# Patient Record
Sex: Female | Born: 1955 | Race: White | Hispanic: No | Marital: Married | Smoking: Never smoker
Health system: Southern US, Community
[De-identification: ages and names within clinical notes are randomized; demographics above are authoritative.]

## PROBLEM LIST (undated history)

## (undated) DIAGNOSIS — E785 Hyperlipidemia, unspecified: Secondary | ICD-10-CM

## (undated) DIAGNOSIS — T7840XA Allergy, unspecified, initial encounter: Secondary | ICD-10-CM

## (undated) DIAGNOSIS — G43909 Migraine, unspecified, not intractable, without status migrainosus: Secondary | ICD-10-CM

## (undated) DIAGNOSIS — R011 Cardiac murmur, unspecified: Secondary | ICD-10-CM

## (undated) DIAGNOSIS — I10 Essential (primary) hypertension: Secondary | ICD-10-CM

## (undated) HISTORY — PX: OVARIAN CYST REMOVAL: SHX89

## (undated) HISTORY — DX: Allergy, unspecified, initial encounter: T78.40XA

## (undated) HISTORY — DX: Essential (primary) hypertension: I10

## (undated) HISTORY — DX: Cardiac murmur, unspecified: R01.1

## (undated) HISTORY — DX: Hyperlipidemia, unspecified: E78.5

## (undated) HISTORY — DX: Migraine, unspecified, not intractable, without status migrainosus: G43.909

## (undated) HISTORY — PX: APPENDECTOMY: SHX54

---

## 2015-09-09 LAB — HM COLONOSCOPY

## 2020-06-21 ENCOUNTER — Ambulatory Visit: Payer: Self-pay | Admitting: Nurse Practitioner

## 2020-06-28 ENCOUNTER — Encounter: Payer: Self-pay | Admitting: Nurse Practitioner

## 2020-06-28 ENCOUNTER — Ambulatory Visit (INDEPENDENT_AMBULATORY_CARE_PROVIDER_SITE_OTHER): Payer: BC Managed Care – PPO | Admitting: Nurse Practitioner

## 2020-06-28 ENCOUNTER — Other Ambulatory Visit: Payer: Self-pay

## 2020-06-28 VITALS — BP 150/69 | HR 86 | Temp 98.8°F | Resp 20 | Ht 62.0 in | Wt 176.0 lb

## 2020-06-28 DIAGNOSIS — Z139 Encounter for screening, unspecified: Secondary | ICD-10-CM | POA: Insufficient documentation

## 2020-06-28 DIAGNOSIS — E785 Hyperlipidemia, unspecified: Secondary | ICD-10-CM | POA: Diagnosis not present

## 2020-06-28 DIAGNOSIS — I10 Essential (primary) hypertension: Secondary | ICD-10-CM | POA: Insufficient documentation

## 2020-06-28 DIAGNOSIS — Z7689 Persons encountering health services in other specified circumstances: Secondary | ICD-10-CM | POA: Diagnosis not present

## 2020-06-28 DIAGNOSIS — G43909 Migraine, unspecified, not intractable, without status migrainosus: Secondary | ICD-10-CM | POA: Diagnosis not present

## 2020-06-28 DIAGNOSIS — Z0001 Encounter for general adult medical examination with abnormal findings: Secondary | ICD-10-CM | POA: Insufficient documentation

## 2020-06-28 DIAGNOSIS — Z1231 Encounter for screening mammogram for malignant neoplasm of breast: Secondary | ICD-10-CM

## 2020-06-28 NOTE — Assessment & Plan Note (Addendum)
-  requested medical records -[facility], includes her migraine medication as well

## 2020-06-28 NOTE — Assessment & Plan Note (Signed)
-  BP elevated today at 150/69 -taking enalapril 10 mg daily

## 2020-06-28 NOTE — Progress Notes (Signed)
New Patient Office Visit  Subjective:  Patient ID: Erin Griffin, female    DOB: 1955/11/15  Age: 65 y.o. MRN: [id number]  CC:  Chief Complaint  Patient presents with  . New Patient (Initial Visit)    HPI Erin Griffin presents for new patient visit. Transferring care from [facility] in [place] Last physical was in June 2021. Last labs were performed at that time.  Past Medical History:  Diagnosis Date  . Heart murmur    Phreesia 06/19/2020  . Hyperlipidemia    Phreesia 06/19/2020  . Hypertension    Phreesia 06/19/2020  . Migraine    no aura    Past Surgical History:  Procedure Laterality Date  . APPENDECTOMY N/A    Phreesia 06/19/2020  . OVARIAN CYST REMOVAL Right     History reviewed. No pertinent family history.  Social History   Socioeconomic History  . Marital status: Married    Spouse name: Not on file  . Number of children: Not on file  . Years of education: Not on file  . Highest education level: Not on file  Occupational History  . Occupation: Retired  Tobacco Use  . Smoking status: Never Smoker  . Smokeless tobacco: Never Used  Vaping Use  . Vaping Use: Never used  Substance and Sexual Activity  . Alcohol use: Yes    Alcohol/week: 2.0 standard drinks    Types: 2 Glasses of wine per week    Comment: per week   . Drug use: Never  . Sexual activity: Yes  Other Topics Concern  . Not on file  Social History Narrative  . Not on file   Social Determinants of Health   Financial Resource Strain: Not on file  Food Insecurity: Not on file  Transportation Needs: Not on file  Physical Activity: Not on file  Stress: Not on file  Social Connections: Not on file  Intimate Partner Violence: Not on file    ROS Review of Systems  Constitutional: Negative.   Respiratory: Negative.   Cardiovascular: Negative.   Neurological:       Cervical radiculopathy- did PT but still has some issue with neck range of motion; no acute concerns  today    Objective:   Today's Vitals: BP (!) 150/69   Pulse 86   Temp 98.8 F (37.1 C)   Resp 20   Ht 5' 2"  (1.575 m)   Wt 176 lb (79.8 kg)   SpO2 97%   BMI 32.19 kg/m   Physical Exam  Assessment & Plan:   Problem List Items Addressed This Visit      Cardiovascular and Mediastinum   Hypertension, essential    -BP elevated today at 150/69 -taking enalapril 10 mg daily      Relevant Medications   simvastatin (ZOCOR) 20 MG tablet   enalapril (VASOTEC) 10 MG tablet   Other Relevant Orders   CBC with Differential/Platelet   CMP14+EGFR   Migraine    -no issues today -takes eletriptan 20 mg PRN      Relevant Medications   simvastatin (ZOCOR) 20 MG tablet   enalapril (VASOTEC) 10 MG tablet   eletriptan (RELPAX) 20 MG tablet     Other   Hyperlipidemia    -no labs to review today -takes simvastatin 20 mg daily      Relevant Medications   simvastatin (ZOCOR) 20 MG tablet   enalapril (VASOTEC) 10 MG tablet   Other Relevant Orders   Lipid Panel With LDL/HDL Ratio  Encounter to establish care - Primary    -requested medical records [facility], includes her migraine medication as well      Relevant Orders   CBC with Differential/Platelet   CMP14+EGFR   Lipid Panel With LDL/HDL Ratio   Screening due    -will screen for HCV and HIV with next set of labs      Relevant Orders   MM Digital Screening   HCV Ab w/Rflx to Verification   HIV Antibody (routine testing w rflx)    Other Visit Diagnoses    Breast cancer screening by mammogram       Relevant Orders   MM Digital Screening      Outpatient Encounter Medications as of 06/28/2020  Medication Sig  . eletriptan (RELPAX) 20 MG tablet Take 20 mg by mouth as needed for migraine or headache. May repeat in 2 hours if headache persists or recurs.  . enalapril (VASOTEC) 10 MG tablet Take 10 mg by mouth daily.  . simvastatin (ZOCOR) 20 MG tablet Take 20 mg by mouth daily.   No  facility-administered encounter medications on file as of 06/28/2020.    Follow-up: Return in about 1 month (around 07/29/2020) for Lab follow-up.   Noreene Larsson, NP

## 2020-06-28 NOTE — Assessment & Plan Note (Signed)
-  will screen for HCV and HIV with next set of labs 

## 2020-06-28 NOTE — Assessment & Plan Note (Signed)
-  no issues today -takes eletriptan 20 mg PRN

## 2020-06-28 NOTE — Assessment & Plan Note (Signed)
-  no labs to review today -takes simvastatin 20 mg daily

## 2020-06-28 NOTE — Patient Instructions (Addendum)
It was great meeting you today.  We will meet up again in 1 month for a lab follow-up.  If you need any refills, please call our office and let us know.  Please have fasting labs drawn 2-3 days prior to your next visit.

## 2020-07-14 ENCOUNTER — Ambulatory Visit (HOSPITAL_COMMUNITY)
Admission: RE | Admit: 2020-07-14 | Discharge: 2020-07-14 | Disposition: A | Discharge status: Home / Self Care | Payer: BC Managed Care – PPO | Source: Ambulatory Visit | Attending: Nurse Practitioner | Admitting: Nurse Practitioner

## 2020-07-14 ENCOUNTER — Other Ambulatory Visit: Payer: Self-pay

## 2020-07-14 ENCOUNTER — Encounter (HOSPITAL_COMMUNITY): Payer: Self-pay

## 2020-07-14 DIAGNOSIS — Z1231 Encounter for screening mammogram for malignant neoplasm of breast: Secondary | ICD-10-CM | POA: Insufficient documentation

## 2020-07-14 DIAGNOSIS — Z139 Encounter for screening, unspecified: Secondary | ICD-10-CM

## 2020-07-25 DIAGNOSIS — Z139 Encounter for screening, unspecified: Secondary | ICD-10-CM | POA: Diagnosis not present

## 2020-07-25 DIAGNOSIS — R7301 Impaired fasting glucose: Secondary | ICD-10-CM | POA: Diagnosis not present

## 2020-07-25 DIAGNOSIS — E785 Hyperlipidemia, unspecified: Secondary | ICD-10-CM | POA: Diagnosis not present

## 2020-07-25 DIAGNOSIS — Z7689 Persons encountering health services in other specified circumstances: Secondary | ICD-10-CM | POA: Diagnosis not present

## 2020-07-25 DIAGNOSIS — I10 Essential (primary) hypertension: Secondary | ICD-10-CM | POA: Diagnosis not present

## 2020-07-26 ENCOUNTER — Other Ambulatory Visit: Payer: Self-pay | Admitting: Nurse Practitioner

## 2020-07-26 DIAGNOSIS — R7301 Impaired fasting glucose: Secondary | ICD-10-CM

## 2020-07-26 LAB — LIPID PANEL WITH LDL/HDL RATIO
Cholesterol, Total: 200 mg/dL — ABNORMAL HIGH (ref 100–199)
HDL: 60 mg/dL (ref >39)
LDL Chol Calc (NIH): 114 mg/dL — ABNORMAL HIGH (ref 0–99)
LDL/HDL Ratio: 1.9 ratio (ref 0.0–3.2)
Triglycerides: 151 mg/dL — ABNORMAL HIGH (ref 0–149)
VLDL Cholesterol Cal: 26 mg/dL (ref 5–40)

## 2020-07-26 LAB — CMP14+EGFR
ALT: 22 IU/L (ref 0–32)
AST: 22 IU/L (ref 0–40)
Albumin/Globulin Ratio: 2 (ref 1.2–2.2)
Albumin: 4.5 g/dL (ref 3.8–4.8)
Alkaline Phosphatase: 109 IU/L (ref 44–121)
BUN/Creatinine Ratio: 25 (ref 12–28)
BUN: 19 mg/dL (ref 8–27)
Bilirubin Total: 0.5 mg/dL (ref 0.0–1.2)
CO2: 23 mmol/L (ref 20–29)
Calcium: 9.8 mg/dL (ref 8.7–10.3)
Chloride: 102 mmol/L (ref 96–106)
Creatinine, Ser: 0.77 mg/dL (ref 0.57–1.00)
GFR calc Af Amer: 95 mL/min/{1.73_m2} (ref >59)
GFR calc non Af Amer: 82 mL/min/{1.73_m2} (ref >59)
Globulin, Total: 2.2 g/dL (ref 1.5–4.5)
Glucose: 104 mg/dL — ABNORMAL HIGH (ref 65–99)
Potassium: 4.5 mmol/L (ref 3.5–5.2)
Sodium: 143 mmol/L (ref 134–144)
Total Protein: 6.7 g/dL (ref 6.0–8.5)

## 2020-07-26 LAB — CBC WITH DIFFERENTIAL/PLATELET
Basophils Absolute: 0.1 10*3/uL (ref 0.0–0.2)
Basos: 1 %
EOS (ABSOLUTE): 0.1 10*3/uL (ref 0.0–0.4)
Eos: 3 %
Hematocrit: 43.1 % (ref 34.0–46.6)
Hemoglobin: 14.1 g/dL (ref 11.1–15.9)
Immature Grans (Abs): 0 10*3/uL (ref 0.0–0.1)
Immature Granulocytes: 0 %
Lymphocytes Absolute: 1.8 10*3/uL (ref 0.7–3.1)
Lymphs: 32 %
MCH: 30.7 pg (ref 26.6–33.0)
MCHC: 32.7 g/dL (ref 31.5–35.7)
MCV: 94 fL (ref 79–97)
Monocytes Absolute: 0.4 10*3/uL (ref 0.1–0.9)
Monocytes: 7 %
Neutrophils Absolute: 3.3 10*3/uL (ref 1.4–7.0)
Neutrophils: 57 %
Platelets: 235 10*3/uL (ref 150–450)
RBC: 4.59 x10E6/uL (ref 3.77–5.28)
RDW: 12.4 % (ref 11.7–15.4)
WBC: 5.7 10*3/uL (ref 3.4–10.8)

## 2020-07-26 LAB — HIV ANTIBODY (ROUTINE TESTING W REFLEX): HIV Screen 4th Generation wRfx: NONREACTIVE

## 2020-07-26 LAB — HCV AB W/RFLX TO VERIFICATION: HCV Ab: <0.1 s/co ratio (ref 0.0–0.9)

## 2020-07-26 LAB — HCV INTERPRETATION

## 2020-07-27 LAB — SPECIMEN STATUS REPORT

## 2020-07-27 LAB — HEMOGLOBIN A1C
Est. average glucose Bld gHb Est-mCnc: 123 mg/dL
Hgb A1c MFr Bld: 5.9 % — ABNORMAL HIGH (ref 4.8–5.6)

## 2020-07-27 NOTE — Progress Notes (Signed)
A1c is 5.9 which is in the prediabetic range; no need to add or change medications. Her cholesterol is a little elevated, but we will discuss that at her visit on 2/25.

## 2020-07-29 ENCOUNTER — Other Ambulatory Visit: Payer: Self-pay

## 2020-07-29 ENCOUNTER — Encounter: Payer: Self-pay | Admitting: Nurse Practitioner

## 2020-07-29 ENCOUNTER — Ambulatory Visit: Payer: BC Managed Care – PPO | Admitting: Nurse Practitioner

## 2020-07-29 DIAGNOSIS — E785 Hyperlipidemia, unspecified: Secondary | ICD-10-CM

## 2020-07-29 DIAGNOSIS — G43909 Migraine, unspecified, not intractable, without status migrainosus: Secondary | ICD-10-CM

## 2020-07-29 DIAGNOSIS — I10 Essential (primary) hypertension: Secondary | ICD-10-CM | POA: Diagnosis not present

## 2020-07-29 MED ORDER — PROPRANOLOL HCL 40 MG PO TABS: 40.0000 mg | ORAL_TABLET | Freq: Every day | ORAL | 1 refills | Status: DC

## 2020-07-29 NOTE — Progress Notes (Signed)
Established Patient Office Visit  Subjective:  Patient ID: Erin Griffin, female    DOB: Dec 11, 1955  Age: 65 y.o. MRN: [id number]  CC:  Chief Complaint  Patient presents with  . Follow-up    Go over labs.     HPI Erin Griffin presents for lab follow-up. She has HTN, and her BP has been running high.  She states that she been having migraines several times per week. She has been taking eletriptan, and that sometimes works. She is having significantly > 4 migraine days per month. Her pain starts on the right side of her head near her ear and is worse in the AM.  Past Medical History:  Diagnosis Date  . Heart murmur    Phreesia 06/19/2020  . Hyperlipidemia    Phreesia 06/19/2020  . Hypertension    Phreesia 06/19/2020  . Migraine    no aura    Past Surgical History:  Procedure Laterality Date  . APPENDECTOMY N/A    Phreesia 06/19/2020  . OVARIAN CYST REMOVAL Right     Family History  Problem Relation Age of Onset  . Breast cancer Mother   . Breast cancer Sister     Social History   Socioeconomic History  . Marital status: Married    Spouse name: Not on file  . Number of children: Not on file  . Years of education: Not on file  . Highest education level: Not on file  Occupational History  . Occupation: Retired  Tobacco Use  . Smoking status: Never Smoker  . Smokeless tobacco: Never Used  Vaping Use  . Vaping Use: Never used  Substance and Sexual Activity  . Alcohol use: Yes    Alcohol/week: 2.0 standard drinks    Types: 2 Glasses of wine per week    Comment: per week   . Drug use: Never  . Sexual activity: Yes  Other Topics Concern  . Not on file  Social History Narrative  . Not on file   Social Determinants of Health   Financial Resource Strain: Not on file  Food Insecurity: Not on file  Transportation Needs: Not on file  Physical Activity: Not on file  Stress: Not on file  Social Connections: Not on file  Intimate Partner Violence: Not on  file    Outpatient Medications Prior to Visit  Medication Sig Dispense Refill  . eletriptan (RELPAX) 20 MG tablet Take 20 mg by mouth as needed for migraine or headache. May repeat in 2 hours if headache persists or recurs.    . enalapril (VASOTEC) 10 MG tablet Take 10 mg by mouth daily.    . simvastatin (ZOCOR) 20 MG tablet Take 20 mg by mouth daily.     No facility-administered medications prior to visit.    Allergies  Allergen Reactions  . Shellfish Allergy Hives, Itching and Swelling    ROS Review of Systems  Constitutional: Negative.   Respiratory: Negative.   Cardiovascular: Negative.   Neurological: Positive for headaches.  Psychiatric/Behavioral: Negative.       Objective:    Physical Exam Constitutional:      Appearance: Normal appearance.  Cardiovascular:     Rate and Rhythm: Normal rate and regular rhythm.     Pulses: Normal pulses.     Heart sounds: Normal heart sounds.  Pulmonary:     Effort: Pulmonary effort is normal.     Breath sounds: Normal breath sounds.  Neurological:     General: No focal deficit present.  Mental Status: She is alert and oriented to person, place, and time.     Cranial Nerves: No cranial nerve deficit.     Comments: Unequal pupils with R>L (she states this is her normal x 40 years)  Psychiatric:        Mood and Affect: Mood normal.        Behavior: Behavior normal.        Thought Content: Thought content normal.        Judgment: Judgment normal.     BP (!) 150/83   Pulse 78   Temp 97.8 F (36.6 C)   Resp 18   Ht 5\' 2"  (1.575 m)   Wt 177 lb (80.3 kg)   SpO2 98%   BMI 32.37 kg/m  Wt Readings from Last 3 Encounters:  07/29/20 177 lb (80.3 kg)  06/28/20 176 lb (79.8 kg)     Health Maintenance Due  Topic Date Due  . COVID-19 Vaccine (1) Never done  . COLONOSCOPY (Pts 45-68yrs Insurance coverage will need to be confirmed)  Never done    There are no preventive care reminders to display for this patient.  No  results found for: TSH Lab Results  Component Value Date   WBC 5.7 07/25/2020   HGB 14.1 07/25/2020   HCT 43.1 07/25/2020   MCV 94 07/25/2020   PLT 235 07/25/2020   Lab Results  Component Value Date   NA 143 07/25/2020   K 4.5 07/25/2020   CO2 23 07/25/2020   GLUCOSE 104 (H) 07/25/2020   BUN 19 07/25/2020   CREATININE 0.77 07/25/2020   BILITOT 0.5 07/25/2020   ALKPHOS 109 07/25/2020   AST 22 07/25/2020   ALT 22 07/25/2020   PROT 6.7 07/25/2020   ALBUMIN 4.5 07/25/2020   CALCIUM 9.8 07/25/2020   Lab Results  Component Value Date   CHOL 200 (H) 07/25/2020   Lab Results  Component Value Date   HDL 60 07/25/2020   Lab Results  Component Value Date   LDLCALC 114 (H) 07/25/2020   Lab Results  Component Value Date   TRIG 151 (H) 07/25/2020   No results found for: CHOLHDL Lab Results  Component Value Date   HGBA1C 5.9 (H) 07/25/2020      Assessment & Plan:   Problem List Items Addressed This Visit      Cardiovascular and Mediastinum   Hypertension, essential    -BP still elevated today -Rx. Propranolol (may help with migraines as well)      Relevant Medications   propranolol (INDERAL) 40 MG tablet   Migraine    -takes eletriptan PRN, but she is still having a lot of migraines; several per week -Rx. Propranolol -if no improvement in 4 weeks, return to clinic (sooner if headaches get worse) -may consider nurtec, ajovy, or emgality if symptoms don't resolve, or may consider neuro consult      Relevant Medications   propranolol (INDERAL) 40 MG tablet     Other   Hyperlipidemia    Lab Results  Component Value Date   CHOL 200 (H) 07/25/2020   HDL 60 07/25/2020   LDLCALC 114 (H) 07/25/2020   TRIG 151 (H) 07/25/2020  -will use diet and exercise today to reduce LDL -if no improvement at time of physical, may increase simvastatin       Relevant Medications   propranolol (INDERAL) 40 MG tablet      Meds ordered this encounter  Medications  .  propranolol (INDERAL) 40 MG tablet  Sig: Take 1 tablet (40 mg total) by mouth at bedtime.    Dispense:  90 tablet    Refill:  1    Follow-up: Return in about 6 months (around 01/26/2021) for physical exam (same day fasting labs).    Erin Roberts, NP

## 2020-07-29 NOTE — Assessment & Plan Note (Signed)
Lab Results  Component Value Date   CHOL 200 (H) 07/25/2020   HDL 60 07/25/2020   LDLCALC 114 (H) 07/25/2020   TRIG 151 (H) 07/25/2020  -will use diet and exercise today to reduce LDL -if no improvement at time of physical, may increase simvastatin

## 2020-07-29 NOTE — Assessment & Plan Note (Signed)
-  BP still elevated today -Rx. Propranolol (may help with migraines as well)

## 2020-07-29 NOTE — Patient Instructions (Addendum)
We will meet up in 6 months for a physical. If migraines do not improve in 4 weeks, please return to the clinic. Come in sooner if they get worse. Preventing High Cholesterol Cholesterol is a white, waxy substance similar to fat that the human body needs to help build cells. The liver makes all the cholesterol that a person's body needs. Having high cholesterol (hypercholesterolemia) increases your risk for heart disease and stroke. Extra or excess cholesterol comes from the food that you eat. High cholesterol can often be prevented with diet and lifestyle changes. If you already have high cholesterol, you can control it with diet, lifestyle changes, and medicines. How can high cholesterol affect me? If you have high cholesterol, fatty deposits (plaques) may build up on the walls of your blood vessels. The blood vessels that carry blood away from your heart are called arteries. Plaques make the arteries narrower and stiffer. This in turn can:  Restrict or block blood flow and cause blood clots to form.  Increase your risk for heart attack and stroke. What can increase my risk for high cholesterol? This condition is more likely to develop in people who:  Eat foods that are high in saturated fat or cholesterol. Saturated fat is mostly found in foods that come from animal sources.  Are overweight.  Are not getting enough exercise.  Have a family history of high cholesterol (familial hypercholesterolemia). What actions can I take to prevent this? Nutrition  Eat less saturated fat.  Avoid trans fats (partially hydrogenated oils). These are often found in margarine and in some baked goods, fried foods, and snacks bought in packages.  Avoid precooked or cured meat, such as bacon, sausages, or meat loaves.  Avoid foods and drinks that have added sugars.  Eat more fruits, vegetables, and whole grains.  Choose healthy sources of protein, such as fish, poultry, lean cuts of red meat, beans,  peas, lentils, and nuts.  Choose healthy sources of fat, such as: ? Nuts. ? Vegetable oils, especially olive oil. ? Fish that have healthy fats, such as omega-3 fatty acids. These fish include mackerel or salmon.   Lifestyle  Lose weight if you are overweight. Maintaining a healthy body mass index (BMI) can help prevent or control high cholesterol. It can also lower your risk for diabetes and high blood pressure. Ask your health care provider to help you with a diet and exercise plan to lose weight safely.  Do not use any products that contain nicotine or tobacco, such as cigarettes, e-cigarettes, and chewing tobacco. If you need help quitting, ask your health care provider. Alcohol use  Do not drink alcohol if: ? Your health care provider tells you not to drink. ? You are pregnant, may be pregnant, or are planning to become pregnant.  If you drink alcohol: ? Limit how much you use to:  0-1 drink a day for women.  0-2 drinks a day for men. ? Be aware of how much alcohol is in your drink. In the [place] one drink equals one 12 oz bottle of beer (355 mL), one 5 oz glass of wine (148 mL), or one 1 oz glass of hard liquor (44 mL). Activity  Get enough exercise. Do exercises as told by your health care provider.  Each week, do at least 150 minutes of exercise that takes a medium level of effort (moderate-intensity exercise). This kind of exercise: ? Makes your heart beat faster while allowing you to still be able to talk. ? Can  be done in short sessions several times a day or longer sessions a few times a week. For example, on 5 days each week, you could walk fast or ride your bike 3 times a day for 10 minutes each time.   Medicines  Your health care provider may recommend medicines to help lower cholesterol. This may be a medicine to lower the amount of cholesterol that your liver makes. You may need medicine if: ? Diet and lifestyle changes have not lowered your cholesterol  enough. ? You have high cholesterol and other risk factors for heart disease or stroke.  Take over-the-counter and prescription medicines only as told by your health care provider. General information  Manage your risk factors for high cholesterol. Talk with your health care provider about all your risk factors and how to lower your risk.  Manage other conditions that you have, such as diabetes or high blood pressure (hypertension).  Have blood tests to check your cholesterol levels at regular points in time as told by your health care provider.  Keep all follow-up visits as told by your health care provider. This is important. Where to find more information  American Heart Association: www.heart.org  National Heart, Lung, and Blood Institute: https://wilson-eaton.com/ Summary  High cholesterol increases your risk for heart disease and stroke. By keeping your cholesterol level low, you can reduce your risk for these conditions.  High cholesterol can often be prevented with diet and lifestyle changes.  Work with your health care provider to manage your risk factors, and have your blood tested regularly. This information is not intended to replace advice given to you by your health care provider. Make sure you discuss any questions you have with your health care provider. Document Revised: 03/03/2019 Document Reviewed: 03/03/2019 Elsevier Patient Education  [place].

## 2020-07-29 NOTE — Assessment & Plan Note (Signed)
-  takes eletriptan PRN, but she is still having a lot of migraines; several per week -Rx. Propranolol -if no improvement in 4 weeks, return to clinic (sooner if headaches get worse) -may consider nurtec, ajovy, or emgality if symptoms don't resolve, or may consider neuro consult

## 2020-10-05 ENCOUNTER — Ambulatory Visit
Admission: EM | Admit: 2020-10-05 | Discharge: 2020-10-05 | Disposition: A | Discharge status: Home / Self Care | Payer: BC Managed Care – PPO | Attending: Emergency Medicine | Admitting: Emergency Medicine

## 2020-10-05 ENCOUNTER — Encounter: Payer: Self-pay | Admitting: Emergency Medicine

## 2020-10-05 ENCOUNTER — Other Ambulatory Visit: Payer: Self-pay

## 2020-10-05 DIAGNOSIS — R059 Cough, unspecified: Secondary | ICD-10-CM

## 2020-10-05 DIAGNOSIS — J209 Acute bronchitis, unspecified: Secondary | ICD-10-CM

## 2020-10-05 MED ORDER — BENZONATATE 100 MG PO CAPS: 100.0000 mg | ORAL_CAPSULE | Freq: Three times a day (TID) | ORAL | 0 refills | Status: DC

## 2020-10-05 MED ORDER — PREDNISONE 20 MG PO TABS: 20.0000 mg | ORAL_TABLET | Freq: Two times a day (BID) | ORAL | 0 refills | Status: AC

## 2020-10-05 NOTE — ED Triage Notes (Signed)
Cough and fatigue since Monday morning

## 2020-10-05 NOTE — ED Provider Notes (Signed)
[facility]   [id number] 10/05/20 Arrival Time: 1543   CC: Cough  SUBJECTIVE: History from: patient.  Erin Griffin is a 65 y.o. female who presents with cough and fatigue x 2 days.  Denies sick exposure to COVID, flu or strep.  Has tried OTC medications without relief.  Symptoms are made worse with deep breath.  Reports previous symptoms in the past with bronchitis.   Denies fever, chills, SOB, wheezing, chest pain, nausea, changes in bowel or bladder habits.    ROS: As per HPI.  All other pertinent ROS negative.     Past Medical History:  Diagnosis Date  . Heart murmur    Phreesia 06/19/2020  . Hyperlipidemia    Phreesia 06/19/2020  . Hypertension    Phreesia 06/19/2020  . Migraine    no aura   Past Surgical History:  Procedure Laterality Date  . APPENDECTOMY N/A    Phreesia 06/19/2020  . OVARIAN CYST REMOVAL Right    Allergies  Allergen Reactions  . Shellfish Allergy Hives, Itching and Swelling   No current facility-administered medications on file prior to encounter.   Current Outpatient Medications on File Prior to Encounter  Medication Sig Dispense Refill  . eletriptan (RELPAX) 20 MG tablet Take 20 mg by mouth as needed for migraine or headache. May repeat in 2 hours if headache persists or recurs.    . enalapril (VASOTEC) 10 MG tablet Take 10 mg by mouth daily.    . propranolol (INDERAL) 40 MG tablet Take 1 tablet (40 mg total) by mouth at bedtime. 90 tablet 1  . simvastatin (ZOCOR) 20 MG tablet Take 20 mg by mouth daily.     Social History   Socioeconomic History  . Marital status: Married    Spouse name: Not on file  . Number of children: Not on file  . Years of education: Not on file  . Highest education level: Not on file  Occupational History  . Occupation: Retired  Tobacco Use  . Smoking status: Never Smoker  . Smokeless tobacco: Never Used  Vaping Use  . Vaping Use: Never used  Substance and Sexual Activity  . Alcohol use: Yes     Alcohol/week: 2.0 standard drinks    Types: 2 Glasses of wine per week    Comment: per week   . Drug use: Never  . Sexual activity: Yes  Other Topics Concern  . Not on file  Social History Narrative  . Not on file   Social Determinants of Health   Financial Resource Strain: Not on file  Food Insecurity: Not on file  Transportation Needs: Not on file  Physical Activity: Not on file  Stress: Not on file  Social Connections: Not on file  Intimate Partner Violence: Not on file   Family History  Problem Relation Age of Onset  . Breast cancer Mother   . Breast cancer Sister     OBJECTIVE:  Vitals:   10/05/20 1551  BP: 134/88  Pulse: (!) 103  Resp: 18  Temp: 99.6 F (37.6 C)  TempSrc: Oral  SpO2: 96%     General appearance: alert; appears fatigued, but nontoxic; speaking in full sentences and tolerating own secretions HEENT: NCAT; Ears: EACs clear, TMs pearly gray; Eyes: PERRL.  EOM grossly intact. Nose: nares patent without rhinorrhea, Throat: oropharynx clear, tonsils non erythematous or enlarged, uvula midline  Neck: supple without LAD Lungs: unlabored respirations, symmetrical air entry; cough: mild; no respiratory distress; CTAB Heart: regular rate and rhythm.  Skin: warm and dry Psychological: alert and cooperative; normal mood and affect  ASSESSMENT & PLAN:  1. Cough   2. Acute bronchitis, unspecified organism     Meds ordered this encounter  Medications  . benzonatate (TESSALON) 100 MG capsule    Sig: Take 1 capsule (100 mg total) by mouth every 8 (eight) hours.    Dispense:  21 capsule    Refill:  0    Order Specific Question:   Supervising Provider    Answer:   Eustace Moore [[id number]]  . predniSONE (DELTASONE) 20 MG tablet    Sig: Take 1 tablet (20 mg total) by mouth 2 (two) times daily with a meal for 5 days.    Dispense:  10 tablet    Refill:  0    Order Specific Question:   Supervising Provider    Answer:   Eustace Moore [[id number]]     Get plenty of rest and push fluids Prednisone prescribed.   Tessalon Perles prescribed for cough Use OTC zyrtec for nasal congestion, runny nose, and/or sore throat Use OTC flonase for nasal congestion and runny nose Use medications daily for symptom relief Use OTC medications like ibuprofen or tylenol as needed fever or pain Call or go to the ED if you have any new or worsening symptoms such as fever, worsening cough, shortness of breath, chest tightness, chest pain, turning blue, changes in mental status, etc...   Reviewed expectations re: course of current medical issues. Questions answered. Outlined signs and symptoms indicating need for more acute intervention. Patient verbalized understanding. After Visit Summary given.         Rennis Harding, PA-C 10/05/20 1559

## 2020-10-05 NOTE — Discharge Instructions (Signed)
Get plenty of rest and push fluids Prednisone prescribed.   Tessalon Perles prescribed for cough Use OTC zyrtec for nasal congestion, runny nose, and/or sore throat Use OTC flonase for nasal congestion and runny nose Use medications daily for symptom relief Use OTC medications like ibuprofen or tylenol as needed fever or pain Call or go to the ED if you have any new or worsening symptoms such as fever, worsening cough, shortness of breath, chest tightness, chest pain, turning blue, changes in mental status, etc...  

## 2020-10-28 ENCOUNTER — Other Ambulatory Visit: Payer: Self-pay

## 2020-10-28 ENCOUNTER — Encounter: Payer: Self-pay | Admitting: Emergency Medicine

## 2020-10-28 ENCOUNTER — Ambulatory Visit
Admission: EM | Admit: 2020-10-28 | Discharge: 2020-10-28 | Disposition: A | Discharge status: Home / Self Care | Payer: BC Managed Care – PPO | Attending: Emergency Medicine | Admitting: Emergency Medicine

## 2020-10-28 DIAGNOSIS — H8110 Benign paroxysmal vertigo, unspecified ear: Secondary | ICD-10-CM | POA: Diagnosis not present

## 2020-10-28 DIAGNOSIS — R42 Dizziness and giddiness: Secondary | ICD-10-CM

## 2020-10-28 MED ORDER — MECLIZINE HCL 25 MG PO TABS: 25.0000 mg | ORAL_TABLET | Freq: Three times a day (TID) | ORAL | 0 refills | Status: DC | PRN

## 2020-10-28 MED ORDER — DEXAMETHASONE SODIUM PHOSPHATE 10 MG/ML IJ SOLN
10.0000 mg | Freq: Once | INTRAMUSCULAR | Status: AC
  Administered 2020-10-28: 10 mg via INTRAMUSCULAR

## 2020-10-28 MED ORDER — ONDANSETRON HCL 4 MG PO TABS: 4.0000 mg | ORAL_TABLET | Freq: Four times a day (QID) | ORAL | 0 refills | Status: DC

## 2020-10-28 MED ORDER — PREDNISONE 20 MG PO TABS: 20.0000 mg | ORAL_TABLET | Freq: Two times a day (BID) | ORAL | 0 refills | Status: AC

## 2020-10-28 NOTE — Discharge Instructions (Signed)
Steroid shot given in office Meclizine prescribed.  Take as directed for symptomatic relief.  This medication may make you drowsy so use with cautions while driving or operating heavy machinery. Zofran prescribed.  Take as needed for nausea Prednisone prescribed. Follow up with PCP if symptoms persists Return or go to the ER if you have any new or worsening symptoms fever, chills, nausea, vomiting, slurred speech, facial droop, weakness, chest pain, shortness of breath, etc..Marland Kitchen

## 2020-10-28 NOTE — ED Provider Notes (Signed)
[facility]   [id number] 10/28/20 Arrival Time: 0905  [id number]  SUBJECTIVE:  Erin Griffin is a 65 y.o. female who presents with complaint of dizziness that began few days ago.  Reports recent URI.  Describes the dizziness as "the room spinning." States that it is intermittent with episodes lasting minutes.  Has tried OTC medications without relief.  Symptoms made worse with upright position.  Denies to previous symptoms.  Denies fever, chills, nausea, vomiting, hearing changes, tinnitus, ear pain, chest pain, syncope, SOB, weakness, slurred speech, memory or emotional changes, facial drooping/ asymmetry, incoordination, numbness or tingling, abdominal pain, changes in bowel or bladder habits.    ROS: As per HPI.  All other pertinent ROS negative.    Past Medical History:  Diagnosis Date  . Heart murmur    Phreesia 06/19/2020  . Hyperlipidemia    Phreesia 06/19/2020  . Hypertension    Phreesia 06/19/2020  . Migraine    no aura   Past Surgical History:  Procedure Laterality Date  . APPENDECTOMY N/A    Phreesia 06/19/2020  . OVARIAN CYST REMOVAL Right    Allergies  Allergen Reactions  . Shellfish Allergy Hives, Itching and Swelling   No current facility-administered medications on file prior to encounter.   Current Outpatient Medications on File Prior to Encounter  Medication Sig Dispense Refill  . benzonatate (TESSALON) 100 MG capsule Take 1 capsule (100 mg total) by mouth every 8 (eight) hours. 21 capsule 0  . eletriptan (RELPAX) 20 MG tablet Take 20 mg by mouth as needed for migraine or headache. May repeat in 2 hours if headache persists or recurs.    . enalapril (VASOTEC) 10 MG tablet Take 10 mg by mouth daily.    . propranolol (INDERAL) 40 MG tablet Take 1 tablet (40 mg total) by mouth at bedtime. 90 tablet 1  . simvastatin (ZOCOR) 20 MG tablet Take 20 mg by mouth daily.     Social History   Socioeconomic History  . Marital status: Married    Spouse  name: Not on file  . Number of children: Not on file  . Years of education: Not on file  . Highest education level: Not on file  Occupational History  . Occupation: Retired  Tobacco Use  . Smoking status: Never Smoker  . Smokeless tobacco: Never Used  Vaping Use  . Vaping Use: Never used  Substance and Sexual Activity  . Alcohol use: Yes    Alcohol/week: 2.0 standard drinks    Types: 2 Glasses of wine per week    Comment: per week   . Drug use: Never  . Sexual activity: Yes  Other Topics Concern  . Not on file  Social History Narrative  . Not on file   Social Determinants of Health   Financial Resource Strain: Not on file  Food Insecurity: Not on file  Transportation Needs: Not on file  Physical Activity: Not on file  Stress: Not on file  Social Connections: Not on file  Intimate Partner Violence: Not on file   Family History  Problem Relation Age of Onset  . Breast cancer Mother   . Breast cancer Sister     OBJECTIVE:  Vitals:   10/28/20 0907  BP: (!) 156/84  Pulse: 65  Resp: 17  Temp: 97.9 F (36.6 C)  TempSrc: Oral  SpO2: 97%    General appearance: alert; no distress Eyes: PERRLA; EOMI; conjunctiva normal HENT: normocephalic; atraumatic; TMs normal; nasal mucosa normal; oral mucosa normal Neck:  supple with FROM Lungs: clear to auscultation bilaterally Heart: regular rate and rhythm Extremities: no cyanosis or edema; symmetrical with no gross deformities Skin: warm and dry Neurologic: normal gait; CN 2-12 grossly intact; negative pronator drift; finger to nose without difficulty Psychological: alert and cooperative; normal mood and affect  ASSESSMENT & PLAN:  1. Benign paroxysmal positional vertigo, unspecified laterality   2. Dizziness    Meds ordered this encounter  Medications  . meclizine (ANTIVERT) 25 MG tablet    Sig: Take 1 tablet (25 mg total) by mouth 3 (three) times daily as needed for dizziness.    Dispense:  30 tablet    Refill:  0     Order Specific Question:   Supervising Provider    Answer:   Eustace Moore [[id number]]  . ondansetron (ZOFRAN) 4 MG tablet    Sig: Take 1 tablet (4 mg total) by mouth every 6 (six) hours.    Dispense:  12 tablet    Refill:  0    Order Specific Question:   Supervising Provider    Answer:   Eustace Moore [[id number]]  . predniSONE (DELTASONE) 20 MG tablet    Sig: Take 1 tablet (20 mg total) by mouth 2 (two) times daily with a meal for 5 days.    Dispense:  10 tablet    Refill:  0    Order Specific Question:   Supervising Provider    Answer:   Eustace Moore [[id number]]  . dexamethasone (DECADRON) injection 10 mg   Steroid shot given in office Meclizine prescribed.  Take as directed for symptomatic relief.  This medication may make you drowsy so use with cautions while driving or operating heavy machinery. Zofran prescribed.  Take as needed for nausea Prednisone prescribed. Follow up with PCP if symptoms persists Return or go to the ER if you have any new or worsening symptoms fever, chills, nausea, vomiting, slurred speech, facial droop, weakness, chest pain, shortness of breath, etc...  Reviewed expectations re: course of current medical issues. Questions answered. Outlined signs and symptoms indicating need for more acute intervention. Patient verbalized understanding. After Visit Summary given.    Rennis Harding, PA-C 10/28/20 [phone]

## 2020-10-28 NOTE — ED Triage Notes (Signed)
Triaged by provider  

## 2020-11-01 ENCOUNTER — Telehealth: Payer: Self-pay

## 2020-11-01 ENCOUNTER — Other Ambulatory Visit: Payer: Self-pay

## 2020-11-01 DIAGNOSIS — I10 Essential (primary) hypertension: Secondary | ICD-10-CM

## 2020-11-01 DIAGNOSIS — E785 Hyperlipidemia, unspecified: Secondary | ICD-10-CM

## 2020-11-01 MED ORDER — ENALAPRIL MALEATE 10 MG PO TABS: 10.0000 mg | ORAL_TABLET | Freq: Every day | ORAL | 3 refills | Status: DC

## 2020-11-01 MED ORDER — SIMVASTATIN 20 MG PO TABS: 20.0000 mg | ORAL_TABLET | Freq: Every day | ORAL | 3 refills | Status: DC

## 2020-11-01 NOTE — Telephone Encounter (Signed)
Need med refills: Simvastatin 20 mg Enalapril 10 mg Pharmacy: [facility] [address] [place]

## 2020-11-01 NOTE — Telephone Encounter (Signed)
Rx's refilled. 

## 2020-11-07 ENCOUNTER — Ambulatory Visit: Payer: BC Managed Care – PPO | Admitting: Nurse Practitioner

## 2020-11-07 ENCOUNTER — Encounter: Payer: Self-pay | Admitting: Nurse Practitioner

## 2020-11-07 ENCOUNTER — Other Ambulatory Visit: Payer: Self-pay

## 2020-11-07 VITALS — BP 123/78 | HR 86 | Temp 99.2°F | Resp 18 | Ht 62.0 in | Wt 176.0 lb

## 2020-11-07 DIAGNOSIS — H8110 Benign paroxysmal vertigo, unspecified ear: Secondary | ICD-10-CM | POA: Diagnosis not present

## 2020-11-07 MED ORDER — MECLIZINE HCL 25 MG PO TABS: 25.0000 mg | ORAL_TABLET | Freq: Three times a day (TID) | ORAL | 0 refills | Status: DC | PRN

## 2020-11-07 NOTE — Progress Notes (Signed)
Acute Office Visit  Subjective:    Patient ID: Erin Griffin, female    DOB: 03/21/1956, 65 y.o.   MRN: [id number]  Chief Complaint  Patient presents with  . Dizziness    Pt went to urgent care for bronchitis also had bad vertigo bronchitis is better she was given tessalon pearls has finished and steroids those are finished also she was given meclizine for dizziness this is better but still has it when she is turning over in bed or looking certain directions     HPI Patient is in today for vertigo. She was seen in the ED on 10/28/20 and was treated for vertigo then with meclizine, zofran ,and prednisone. She didn't take the zofran, but took prednisone and meclizine.  She states meclizine helped some.  She states dizziness lasts a few seconds and is worse at night when she is getting in the bed or when she changes her head position.  She states that her pupils have always been unequal.  Past Medical History:  Diagnosis Date  . Heart murmur    Phreesia 06/19/2020  . Hyperlipidemia    Phreesia 06/19/2020  . Hypertension    Phreesia 06/19/2020  . Migraine    no aura    Past Surgical History:  Procedure Laterality Date  . APPENDECTOMY N/A    Phreesia 06/19/2020  . OVARIAN CYST REMOVAL Right     Family History  Problem Relation Age of Onset  . Breast cancer Mother   . Breast cancer Sister     Social History   Socioeconomic History  . Marital status: Married    Spouse name: Not on file  . Number of children: Not on file  . Years of education: Not on file  . Highest education level: Not on file  Occupational History  . Occupation: Retired  Tobacco Use  . Smoking status: Never Smoker  . Smokeless tobacco: Never Used  Vaping Use  . Vaping Use: Never used  Substance and Sexual Activity  . Alcohol use: Yes    Alcohol/week: 2.0 standard drinks    Types: 2 Glasses of wine per week    Comment: per week   . Drug use: Never  . Sexual activity: Yes  Other Topics Concern   . Not on file  Social History Narrative  . Not on file   Social Determinants of Health   Financial Resource Strain: Not on file  Food Insecurity: Not on file  Transportation Needs: Not on file  Physical Activity: Not on file  Stress: Not on file  Social Connections: Not on file  Intimate Partner Violence: Not on file    Outpatient Medications Prior to Visit  Medication Sig Dispense Refill  . eletriptan (RELPAX) 20 MG tablet Take 20 mg by mouth as needed for migraine or headache. May repeat in 2 hours if headache persists or recurs.    . enalapril (VASOTEC) 10 MG tablet Take 1 tablet (10 mg total) by mouth daily. 30 tablet 3  . ondansetron (ZOFRAN) 4 MG tablet Take 1 tablet (4 mg total) by mouth every 6 (six) hours. 12 tablet 0  . propranolol (INDERAL) 40 MG tablet Take 1 tablet (40 mg total) by mouth at bedtime. 90 tablet 1  . simvastatin (ZOCOR) 20 MG tablet Take 1 tablet (20 mg total) by mouth daily. 30 tablet 3  . meclizine (ANTIVERT) 25 MG tablet Take 1 tablet (25 mg total) by mouth 3 (three) times daily as needed for dizziness. 30 tablet 0  .  benzonatate (TESSALON) 100 MG capsule Take 1 capsule (100 mg total) by mouth every 8 (eight) hours. (Patient not taking: Reported on 11/07/2020) 21 capsule 0   No facility-administered medications prior to visit.    Allergies  Allergen Reactions  . Shellfish Allergy Hives, Itching and Swelling    Review of Systems  Constitutional: Negative.   Respiratory: Negative.   Cardiovascular: Negative.   Neurological: Positive for dizziness.       Objective:    Physical Exam Constitutional:      Appearance: Normal appearance.  Neurological:     Mental Status: She is alert.     Comments: Positive Dix-Hallpike maneuver -unequal pupils are her baseline     BP 123/78 (BP Location: Right Arm, Patient Position: Sitting, Cuff Size: Normal)   Pulse 86   Temp 99.2 F (37.3 C) (Oral)   Resp 18   Ht 5\' 2"  (1.575 m)   Wt 176 lb (79.8  kg)   SpO2 96%   BMI 32.19 kg/m  Wt Readings from Last 3 Encounters:  11/07/20 176 lb (79.8 kg)  07/29/20 177 lb (80.3 kg)  06/28/20 176 lb (79.8 kg)    Health Maintenance Due  Topic Date Due  . COVID-19 Vaccine (1) Never done  . PAP SMEAR-Modifier  Never done  . COLONOSCOPY (Pts 45-66yrs Insurance coverage will need to be confirmed)  Never done  . Zoster Vaccines- Shingrix (1 of 2) Never done    There are no preventive care reminders to display for this patient.   No results found for: TSH Lab Results  Component Value Date   WBC 5.7 07/25/2020   HGB 14.1 07/25/2020   HCT 43.1 07/25/2020   MCV 94 07/25/2020   PLT 235 07/25/2020   Lab Results  Component Value Date   NA 143 07/25/2020   K 4.5 07/25/2020   CO2 23 07/25/2020   GLUCOSE 104 (H) 07/25/2020   BUN 19 07/25/2020   CREATININE 0.77 07/25/2020   BILITOT 0.5 07/25/2020   ALKPHOS 109 07/25/2020   AST 22 07/25/2020   ALT 22 07/25/2020   PROT 6.7 07/25/2020   ALBUMIN 4.5 07/25/2020   CALCIUM 9.8 07/25/2020   Lab Results  Component Value Date   CHOL 200 (H) 07/25/2020   Lab Results  Component Value Date   HDL 60 07/25/2020   Lab Results  Component Value Date   LDLCALC 114 (H) 07/25/2020   Lab Results  Component Value Date   TRIG 151 (H) 07/25/2020   No results found for: CHOLHDL Lab Results  Component Value Date   HGBA1C 5.9 (H) 07/25/2020       Assessment & Plan:   Problem List Items Addressed This Visit      Nervous and Auditory   BPPV (benign paroxysmal positional vertigo) - Primary    -positive dix-hallpike maneuver today -refilled meclizine -handout for Epley maneuver -referral to PT since vertigo is refractory -man consider ENT vs neuro if symptoms persist      Relevant Medications   meclizine (ANTIVERT) 25 MG tablet   Other Relevant Orders   Ambulatory referral to Physical Therapy       Meds ordered this encounter  Medications  . meclizine (ANTIVERT) 25 MG tablet     Sig: Take 1 tablet (25 mg total) by mouth 3 (three) times daily as needed for dizziness.    Dispense:  30 tablet    Refill:  0     07/27/2020, NP

## 2020-11-07 NOTE — Patient Instructions (Addendum)
I included handouts about vertigo and the Epley maneuver.  I encourage you to watch a youtube video on the Epley maneuver to see how physical therapy does it.  Benign Positional Vertigo Vertigo is the feeling that you or your surroundings are moving when they are not. Benign positional vertigo is the most common form of vertigo. This is usually a harmless condition (benign). This condition is positional. This means that symptoms are triggered by certain movements and positions. This condition can be dangerous if it occurs while you are doing something that could cause harm to you or others. This includes activities such as driving or operating machinery. What are the causes? The inner ear has fluid-filled canals that help your brain sense movement and balance. When the fluid moves, the brain receives messages about your body's position. With benign positional vertigo, crystals in the inner ear break free and disturb the inner ear area. This causes your brain to receive confusing messages about your body's position. What increases the risk? You are more likely to develop this condition if:  You are a woman.  You are 20 years of age or older.  You have recently had a head injury.  You have an inner ear disease. What are the signs or symptoms? Symptoms of this condition usually happen when you move your head or your eyes in different directions. Symptoms may start suddenly, and usually last for less than a minute. They include:  Loss of balance and falling.  Feeling like you are spinning or moving.  Feeling like your surroundings are spinning or moving.  Nausea and vomiting.  Blurred vision.  Dizziness.  Involuntary eye movement (nystagmus). Symptoms can be mild and cause only minor problems, or they can be severe and interfere with daily life. Episodes of benign positional vertigo may return (recur) over time. Symptoms may improve over time. How is this diagnosed? This condition may  be diagnosed based on:  Your medical history.  Physical exam of the head, neck, and ears.  Positional tests to check for or stimulate vertigo. You may be asked to turn your head and change positions, such as going from sitting to lying down. A health care provider will watch for symptoms of vertigo. You may be referred to a health care provider who specializes in ear, nose, and throat problems (ENT, or otolaryngologist) or a provider who specializes in disorders of the nervous system (neurologist). How is this treated? This condition may be treated in a session in which your health care provider moves your head in specific positions to help the displaced crystals in your inner ear move. Treatment for this condition may take several sessions. Surgery may be needed in severe cases, but this is rare. In some cases, benign positional vertigo may resolve on its own in 2-4 weeks.   Follow these instructions at home: Safety  Move slowly. Avoid sudden body or head movements or certain positions, as told by your health care provider.  Avoid driving until your health care provider says it is safe for you to do so.  Avoid operating heavy machinery until your health care provider says it is safe for you to do so.  Avoid doing any tasks that would be dangerous to you or others if vertigo occurs.  If you have trouble walking or keeping your balance, try using a cane for stability. If you feel dizzy or unstable, sit down right away.  Return to your normal activities as told by your health care provider. Ask  your health care provider what activities are safe for you. General instructions  Take over-the-counter and prescription medicines only as told by your health care provider.  Drink enough fluid to keep your urine pale yellow.  Keep all follow-up visits as told by your health care provider. This is important. Contact a health care provider if:  You have a fever.  Your condition gets worse or  you develop new symptoms.  Your family or friends notice any behavioral changes.  You have nausea or vomiting that gets worse.  You have numbness or a prickling and tingling sensation. Get help right away if you:  Have difficulty speaking or moving.  Are always dizzy.  Faint.  Develop severe headaches.  Have weakness in your legs or arms.  Have changes in your hearing or vision.  Develop a stiff neck.  Develop sensitivity to light. Summary  Vertigo is the feeling that you or your surroundings are moving when they are not. Benign positional vertigo is the most common form of vertigo.  This condition is caused by crystals in the inner ear that become displaced. This causes a disturbance in an area of the inner ear that helps your brain sense movement and balance.  Symptoms include loss of balance and falling, feeling that you or your surroundings are moving, nausea and vomiting, and blurred vision.  This condition can be diagnosed based on symptoms, a physical exam, and positional tests.  Follow safety instructions as told by your health care provider. You will also be told when to contact your health care provider in case of problems. This information is not intended to replace advice given to you by your health care provider. Make sure you discuss any questions you have with your health care provider. Document Revised: 04/14/2019 Document Reviewed: 10/30/2017 Elsevier Patient Education  2021 [employer]  How to Perform the Epley Maneuver The Epley maneuver is an exercise that relieves symptoms of vertigo. Vertigo is the feeling that you or your surroundings are moving when they are not. When you feel vertigo, you may feel like the room is spinning and may have trouble walking. The Epley maneuver is used for a type of vertigo caused by a calcium deposit in a part of the inner ear. The maneuver involves changing head positions to help the deposit move out of the area. You  can do this maneuver at home whenever you have symptoms of vertigo. You can repeat it in 24 hours if your vertigo has not gone away. Even though the Epley maneuver may relieve your vertigo for a few weeks, it is possible that your symptoms will return. This maneuver relieves vertigo, but it does not relieve dizziness. What are the risks? If it is done correctly, the Epley maneuver is considered safe. Sometimes it can lead to dizziness or nausea that goes away after a short time. If you develop other symptoms--such as changes in vision, weakness, or numbness--stop doing the maneuver and call your health care provider. Supplies needed:  A bed or table.  A pillow. How to do the Epley maneuver 1. Sit on the edge of a bed or table with your back straight and your legs extended or hanging over the edge of the bed or table. 2. Turn your head halfway toward the affected ear or side as told by your health care provider. 3. Lie backward quickly with your head turned until you are lying flat on your back. You may want to position a pillow under your shoulders.  4. Hold this position for at least 30 seconds. If you feel dizzy or have symptoms of vertigo, continue to hold the position until the symptoms stop. 5. Turn your head to the opposite direction until your unaffected ear is facing the floor. 6. Hold this position for at least 30 seconds. If you feel dizzy or have symptoms of vertigo, continue to hold the position until the symptoms stop. 7. Turn your whole body to the same side as your head so that you are positioned on your side. Your head will now be nearly facedown. Hold for at least 30 seconds. If you feel dizzy or have symptoms of vertigo, continue to hold the position until the symptoms stop. 8. Sit back up. You can repeat the maneuver in 24 hours if your vertigo does not go away.      Follow these instructions at home: For 24 hours after doing the Epley maneuver:  Keep your head in an upright  position.  When lying down to sleep or rest, keep your head raised (elevated) with two or more pillows.  Avoid excessive neck movements. Activity  Do not drive or use machinery if you feel dizzy.  After doing the Epley maneuver, return to your normal activities as told by your health care provider. Ask your health care provider what activities are safe for you. General instructions  Drink enough fluid to keep your urine pale yellow.  Do not drink alcohol.  Take over-the-counter and prescription medicines only as told by your health care provider.  Keep all follow-up visits as told by your health care provider. This is important. Preventing vertigo symptoms Ask your health care provider if there is anything you should do at home to prevent vertigo. He or she may recommend that you:  Keep your head elevated with two or more pillows while you sleep.  Do not sleep on the side of your affected ear.  Get up slowly from bed.  Avoid sudden movements during the day.  Avoid extreme head positions or movement, such as looking up or bending over. Contact a health care provider if:  Your vertigo gets worse.  You have other symptoms, including: ? Nausea. ? Vomiting. ? Headache. Get help right away if you:  Have vision changes.  Have a headache or neck pain that is severe or getting worse.  Cannot stop vomiting.  Have new numbness or weakness in any part of your body. Summary  Vertigo is the feeling that you or your surroundings are moving when they are not.  The Epley maneuver is an exercise that relieves symptoms of vertigo.  If the Epley maneuver is done correctly, it is considered safe and relieves vertigo quickly. This information is not intended to replace advice given to you by your health care provider. Make sure you discuss any questions you have with your health care provider. Document Revised: 03/18/2019 Document Reviewed: 03/18/2019 Elsevier Patient Education   2021 [employer].

## 2020-11-07 NOTE — Assessment & Plan Note (Signed)
-  positive dix-hallpike maneuver today -refilled meclizine -handout for Epley maneuver -referral to PT since vertigo is refractory -man consider ENT vs neuro if symptoms persist

## 2020-11-22 ENCOUNTER — Ambulatory Visit (HOSPITAL_COMMUNITY): Payer: BC Managed Care – PPO | Admitting: Physical Therapy

## 2021-01-02 ENCOUNTER — Other Ambulatory Visit: Payer: Self-pay

## 2021-01-02 MED ORDER — PROPRANOLOL HCL 40 MG PO TABS: 40.0000 mg | ORAL_TABLET | Freq: Every day | ORAL | 1 refills | Status: DC

## 2021-01-23 DIAGNOSIS — R7301 Impaired fasting glucose: Secondary | ICD-10-CM | POA: Diagnosis not present

## 2021-01-24 LAB — HEMOGLOBIN A1C
Est. average glucose Bld gHb Est-mCnc: 123 mg/dL
Hgb A1c MFr Bld: 5.9 % — ABNORMAL HIGH (ref 4.8–5.6)

## 2021-01-24 NOTE — Progress Notes (Signed)
A1c is stable at 5.9, so no need to add or change medicines.

## 2021-01-27 ENCOUNTER — Encounter: Payer: BC Managed Care – PPO | Admitting: Nurse Practitioner

## 2021-01-30 ENCOUNTER — Ambulatory Visit (INDEPENDENT_AMBULATORY_CARE_PROVIDER_SITE_OTHER): Payer: BC Managed Care – PPO | Admitting: Nurse Practitioner

## 2021-01-30 ENCOUNTER — Encounter: Payer: Self-pay | Admitting: Nurse Practitioner

## 2021-01-30 ENCOUNTER — Other Ambulatory Visit: Payer: Self-pay

## 2021-01-30 VITALS — BP 144/71 | HR 63 | Temp 98.6°F | Ht 62.0 in | Wt 180.0 lb

## 2021-01-30 DIAGNOSIS — G43909 Migraine, unspecified, not intractable, without status migrainosus: Secondary | ICD-10-CM

## 2021-01-30 DIAGNOSIS — Z0001 Encounter for general adult medical examination with abnormal findings: Secondary | ICD-10-CM | POA: Diagnosis not present

## 2021-01-30 DIAGNOSIS — E785 Hyperlipidemia, unspecified: Secondary | ICD-10-CM

## 2021-01-30 DIAGNOSIS — I10 Essential (primary) hypertension: Secondary | ICD-10-CM

## 2021-01-30 DIAGNOSIS — R7303 Prediabetes: Secondary | ICD-10-CM | POA: Diagnosis not present

## 2021-01-30 NOTE — Progress Notes (Signed)
Established Patient Office Visit  Subjective:  Patient ID: Erin Griffin, female    DOB: [birth year]  Age: 65 y.o. MRN: [id number]  CC:  Chief Complaint  Patient presents with   Annual Exam    CPE    HPI Erin Griffin presents for physical exam.  She had PAP with previous GYN.  Past Medical History:  Diagnosis Date   Heart murmur    Phreesia 06/19/2020   Hyperlipidemia    Phreesia 06/19/2020   Hypertension    Phreesia 06/19/2020   Migraine    no aura    Past Surgical History:  Procedure Laterality Date   APPENDECTOMY N/A    Phreesia 06/19/2020   OVARIAN CYST REMOVAL Right     Family History  Problem Relation Age of Onset   Breast cancer Mother    Breast cancer Sister     Social History   Socioeconomic History   Marital status: Married    Spouse name: Not on file   Number of children: Not on file   Years of education: Not on file   Highest education level: Not on file  Occupational History   Occupation: Retired  Tobacco Use   Smoking status: Never   Smokeless tobacco: Never  Vaping Use   Vaping Use: Never used  Substance and Sexual Activity   Alcohol use: Yes    Alcohol/week: 2.0 standard drinks    Types: 2 Glasses of wine per week    Comment: per week    Drug use: Never   Sexual activity: Yes  Other Topics Concern   Not on file  Social History Narrative   Not on file   Social Determinants of Health   Financial Resource Strain: Not on file  Food Insecurity: Not on file  Transportation Needs: Not on file  Physical Activity: Not on file  Stress: Not on file  Social Connections: Not on file  Intimate Partner Violence: Not on file    Outpatient Medications Prior to Visit  Medication Sig Dispense Refill   eletriptan (RELPAX) 20 MG tablet Take 20 mg by mouth as needed for migraine or headache. May repeat in 2 hours if headache persists or recurs.     enalapril (VASOTEC) 10 MG tablet Take 1 tablet (10 mg total) by mouth daily. 30 tablet 3    propranolol (INDERAL) 40 MG tablet Take 1 tablet (40 mg total) by mouth at bedtime. 90 tablet 1   simvastatin (ZOCOR) 20 MG tablet Take 1 tablet (20 mg total) by mouth daily. 30 tablet 3   meclizine (ANTIVERT) 25 MG tablet Take 1 tablet (25 mg total) by mouth 3 (three) times daily as needed for dizziness. (Patient not taking: Reported on 01/30/2021) 30 tablet 0   ondansetron (ZOFRAN) 4 MG tablet Take 1 tablet (4 mg total) by mouth every 6 (six) hours. (Patient not taking: Reported on 01/30/2021) 12 tablet 0   No facility-administered medications prior to visit.    Allergies  Allergen Reactions   Shellfish Allergy Hives, Itching and Swelling    ROS Review of Systems  Constitutional: Negative.   HENT: Negative.    Eyes: Negative.   Respiratory: Negative.    Cardiovascular: Negative.   Gastrointestinal: Negative.   Endocrine: Negative.   Genitourinary: Negative.   Musculoskeletal: Negative.   Skin: Negative.   Allergic/Immunologic: Negative.   Neurological: Negative.   Hematological: Negative.   Psychiatric/Behavioral: Negative.       Objective:    Physical Exam Constitutional:  Appearance: Normal appearance.  HENT:     Head: Normocephalic and atraumatic.     Right Ear: Tympanic membrane, ear canal and external ear normal.     Left Ear: Tympanic membrane, ear canal and external ear normal.     Nose: Nose normal.     Mouth/Throat:     Mouth: Mucous membranes are moist.     Pharynx: Oropharynx is clear.  Eyes:     Extraocular Movements: Extraocular movements intact.     Conjunctiva/sclera: Conjunctivae normal.     Pupils: Pupils are equal, round, and reactive to light.  Cardiovascular:     Rate and Rhythm: Normal rate and regular rhythm.     Pulses: Normal pulses.     Heart sounds: Normal heart sounds.  Pulmonary:     Effort: Pulmonary effort is normal.     Breath sounds: Normal breath sounds.  Abdominal:     General: Abdomen is flat. Bowel sounds are normal.      Palpations: Abdomen is soft.  Musculoskeletal:        General: Normal range of motion.     Cervical back: Normal range of motion and neck supple.  Skin:    General: Skin is warm and dry.     Capillary Refill: Capillary refill takes less than 2 seconds.  Neurological:     General: No focal deficit present.     Mental Status: She is alert and oriented to person, place, and time.     Cranial Nerves: No cranial nerve deficit.     Sensory: No sensory deficit.     Motor: No weakness.     Coordination: Coordination normal.     Gait: Gait normal.  Psychiatric:        Mood and Affect: Mood normal.        Behavior: Behavior normal.        Thought Content: Thought content normal.        Judgment: Judgment normal.    BP (!) 144/71 (BP Location: Right Arm, Patient Position: Sitting, Cuff Size: Large)   Pulse 63   Temp 98.6 F (37 C) (Oral)   Ht 5' 2"  (1.575 m)   Wt 180 lb (81.6 kg)   SpO2 98%   BMI 32.92 kg/m  Wt Readings from Last 3 Encounters:  01/30/21 180 lb (81.6 kg)  11/07/20 176 lb (79.8 kg)  07/29/20 177 lb (80.3 kg)     Health Maintenance Due  Topic Date Due   COLONOSCOPY (Pts 45-48yr Insurance coverage will need to be confirmed)  Never done   Zoster Vaccines- Shingrix (1 of 2) Never done   INFLUENZA VACCINE  01/02/2021    There are no preventive care reminders to display for this patient.  No results found for: TSH Lab Results  Component Value Date   WBC 5.7 07/25/2020   HGB 14.1 07/25/2020   HCT 43.1 07/25/2020   MCV 94 07/25/2020   PLT 235 07/25/2020   Lab Results  Component Value Date   NA 143 07/25/2020   K 4.5 07/25/2020   CO2 23 07/25/2020   GLUCOSE 104 (H) 07/25/2020   BUN 19 07/25/2020   CREATININE 0.77 07/25/2020   BILITOT 0.5 07/25/2020   ALKPHOS 109 07/25/2020   AST 22 07/25/2020   ALT 22 07/25/2020   PROT 6.7 07/25/2020   ALBUMIN 4.5 07/25/2020   CALCIUM 9.8 07/25/2020   Lab Results  Component Value Date   CHOL 200 (H) 07/25/2020    Lab Results  Component Value Date   HDL 60 07/25/2020   Lab Results  Component Value Date   LDLCALC 114 (H) 07/25/2020   Lab Results  Component Value Date   TRIG 151 (H) 07/25/2020   No results found for: CHOLHDL Lab Results  Component Value Date   HGBA1C 5.9 (H) 01/23/2021      Assessment & Plan:   Problem List Items Addressed This Visit       Cardiovascular and Mediastinum   Hypertension, essential    BP Readings from Last 3 Encounters:  01/30/21 (!) 144/71  11/07/20 123/78  10/28/20 (!) 156/84  -BP slightly elevated today -takes enalapril and propranolol -she states she had a headache this AM and took an OTC NSAID, so BP may be elevated more than usual today      Relevant Orders   CBC with Differential/Platelet   CMP14+EGFR   Lipid Panel With LDL/HDL Ratio   Migraine    -improved with propranolol        Other   Hyperlipidemia    Lab Results  Component Value Date   CHOL 200 (H) 07/25/2020   HDL 60 07/25/2020   LDLCALC 114 (H) 07/25/2020   TRIG 151 (H) 07/25/2020  -will check lipids with next set of labs      Relevant Orders   CMP14+EGFR   Lipid Panel With LDL/HDL Ratio   Encounter for general adult medical examination with abnormal findings - Primary   Relevant Orders   CBC with Differential/Platelet   CMP14+EGFR   Lipid Panel With LDL/HDL Ratio   Hemoglobin A1c   Prediabetes    Lab Results  Component Value Date   HGBA1C 5.9 (H) 01/23/2021  -no new meds today      Relevant Orders   Hemoglobin A1c    No orders of the defined types were placed in this encounter.   Follow-up: Return in about 6 months (around 08/01/2021) for Lab follow-up (HTN, HLD, prediabetes).    Noreene Larsson, NP

## 2021-01-30 NOTE — Assessment & Plan Note (Addendum)
BP Readings from Last 3 Encounters:  01/30/21 (!) 144/71  11/07/20 123/78  10/28/20 (!) 156/84   -BP slightly elevated today -takes enalapril and propranolol -she states she had a headache this AM and took an OTC NSAID, so BP may be elevated more than usual today

## 2021-01-30 NOTE — Assessment & Plan Note (Signed)
-  improved with propranolol

## 2021-01-30 NOTE — Assessment & Plan Note (Signed)
Lab Results  Component Value Date   HGBA1C 5.9 (H) 01/23/2021   -no new meds today

## 2021-01-30 NOTE — Assessment & Plan Note (Signed)
Lab Results  Component Value Date   CHOL 200 (H) 07/25/2020   HDL 60 07/25/2020   LDLCALC 114 (H) 07/25/2020   TRIG 151 (H) 07/25/2020   -will check lipids with next set of labs

## 2021-01-30 NOTE — Patient Instructions (Signed)
Please have fasting labs drawn 2-3 days prior to your appointment so we can discuss the results during your office visit.  

## 2021-03-01 ENCOUNTER — Other Ambulatory Visit: Payer: Self-pay

## 2021-03-01 DIAGNOSIS — E785 Hyperlipidemia, unspecified: Secondary | ICD-10-CM

## 2021-03-01 DIAGNOSIS — I10 Essential (primary) hypertension: Secondary | ICD-10-CM

## 2021-03-01 MED ORDER — SIMVASTATIN 20 MG PO TABS: 20.0000 mg | ORAL_TABLET | Freq: Every day | ORAL | 3 refills | Status: DC

## 2021-03-01 MED ORDER — ENALAPRIL MALEATE 10 MG PO TABS: 10.0000 mg | ORAL_TABLET | Freq: Every day | ORAL | 3 refills | Status: DC

## 2021-07-10 ENCOUNTER — Other Ambulatory Visit: Payer: Self-pay

## 2021-07-10 DIAGNOSIS — I10 Essential (primary) hypertension: Secondary | ICD-10-CM

## 2021-07-10 DIAGNOSIS — E785 Hyperlipidemia, unspecified: Secondary | ICD-10-CM

## 2021-07-10 MED ORDER — SIMVASTATIN 20 MG PO TABS: 20.0000 mg | ORAL_TABLET | Freq: Every day | ORAL | 3 refills | Status: DC

## 2021-07-10 MED ORDER — ENALAPRIL MALEATE 10 MG PO TABS
10.0000 mg | ORAL_TABLET | Freq: Every day | ORAL | 3 refills | Status: DC
Start: 2021-07-10 — End: 2021-10-27

## 2021-07-31 DIAGNOSIS — Z0001 Encounter for general adult medical examination with abnormal findings: Secondary | ICD-10-CM | POA: Diagnosis not present

## 2021-07-31 DIAGNOSIS — R7303 Prediabetes: Secondary | ICD-10-CM | POA: Diagnosis not present

## 2021-07-31 DIAGNOSIS — E785 Hyperlipidemia, unspecified: Secondary | ICD-10-CM | POA: Diagnosis not present

## 2021-07-31 DIAGNOSIS — I10 Essential (primary) hypertension: Secondary | ICD-10-CM | POA: Diagnosis not present

## 2021-08-01 ENCOUNTER — Other Ambulatory Visit: Payer: Self-pay

## 2021-08-01 LAB — LIPID PANEL WITH LDL/HDL RATIO
Cholesterol, Total: 192 mg/dL (ref 100–199)
HDL: 58 mg/dL (ref >39)
LDL Chol Calc (NIH): 109 mg/dL — ABNORMAL HIGH (ref 0–99)
LDL/HDL Ratio: 1.9 ratio (ref 0.0–3.2)
Triglycerides: 142 mg/dL (ref 0–149)
VLDL Cholesterol Cal: 25 mg/dL (ref 5–40)

## 2021-08-01 LAB — CMP14+EGFR
ALT: 21 IU/L (ref 0–32)
AST: 20 IU/L (ref 0–40)
Albumin/Globulin Ratio: 2 (ref 1.2–2.2)
Albumin: 4.3 g/dL (ref 3.8–4.8)
Alkaline Phosphatase: 102 IU/L (ref 44–121)
BUN/Creatinine Ratio: 22 (ref 12–28)
BUN: 17 mg/dL (ref 8–27)
Bilirubin Total: 0.5 mg/dL (ref 0.0–1.2)
CO2: 25 mmol/L (ref 20–29)
Calcium: 9.7 mg/dL (ref 8.7–10.3)
Chloride: 103 mmol/L (ref 96–106)
Creatinine, Ser: 0.76 mg/dL (ref 0.57–1.00)
Globulin, Total: 2.2 g/dL (ref 1.5–4.5)
Glucose: 115 mg/dL — ABNORMAL HIGH (ref 70–99)
Potassium: 5.2 mmol/L (ref 3.5–5.2)
Sodium: 142 mmol/L (ref 134–144)
Total Protein: 6.5 g/dL (ref 6.0–8.5)
eGFR: 87 mL/min/{1.73_m2} (ref >59)

## 2021-08-01 LAB — CBC WITH DIFFERENTIAL/PLATELET
Basophils Absolute: 0.1 10*3/uL (ref 0.0–0.2)
Basos: 1 %
EOS (ABSOLUTE): 0.2 10*3/uL (ref 0.0–0.4)
Eos: 3 %
Hematocrit: 40.6 % (ref 34.0–46.6)
Hemoglobin: 13.9 g/dL (ref 11.1–15.9)
Immature Grans (Abs): 0 10*3/uL (ref 0.0–0.1)
Immature Granulocytes: 0 %
Lymphocytes Absolute: 1.8 10*3/uL (ref 0.7–3.1)
Lymphs: 33 %
MCH: 31.7 pg (ref 26.6–33.0)
MCHC: 34.2 g/dL (ref 31.5–35.7)
MCV: 93 fL (ref 79–97)
Monocytes Absolute: 0.4 10*3/uL (ref 0.1–0.9)
Monocytes: 8 %
Neutrophils Absolute: 3.1 10*3/uL (ref 1.4–7.0)
Neutrophils: 55 %
Platelets: 230 10*3/uL (ref 150–450)
RBC: 4.39 x10E6/uL (ref 3.77–5.28)
RDW: 12.3 % (ref 11.7–15.4)
WBC: 5.6 10*3/uL (ref 3.4–10.8)

## 2021-08-01 LAB — HEMOGLOBIN A1C
Est. average glucose Bld gHb Est-mCnc: 128 mg/dL
Hgb A1c MFr Bld: 6.1 % — ABNORMAL HIGH (ref 4.8–5.6)

## 2021-08-01 MED ORDER — PROPRANOLOL HCL 40 MG PO TABS: 40.0000 mg | ORAL_TABLET | Freq: Every day | ORAL | 1 refills | Status: DC

## 2021-08-03 ENCOUNTER — Other Ambulatory Visit: Payer: Self-pay

## 2021-08-03 ENCOUNTER — Ambulatory Visit: Payer: BC Managed Care – PPO | Admitting: Nurse Practitioner

## 2021-08-03 ENCOUNTER — Encounter: Payer: Self-pay | Admitting: Nurse Practitioner

## 2021-08-03 VITALS — BP 133/64 | HR 71 | Ht 62.0 in | Wt 181.0 lb

## 2021-08-03 DIAGNOSIS — Z139 Encounter for screening, unspecified: Secondary | ICD-10-CM

## 2021-08-03 DIAGNOSIS — R7303 Prediabetes: Secondary | ICD-10-CM | POA: Diagnosis not present

## 2021-08-03 DIAGNOSIS — E785 Hyperlipidemia, unspecified: Secondary | ICD-10-CM

## 2021-08-03 DIAGNOSIS — Z2821 Immunization not carried out because of patient refusal: Secondary | ICD-10-CM | POA: Diagnosis not present

## 2021-08-03 DIAGNOSIS — Z1231 Encounter for screening mammogram for malignant neoplasm of breast: Secondary | ICD-10-CM | POA: Diagnosis not present

## 2021-08-03 DIAGNOSIS — I10 Essential (primary) hypertension: Secondary | ICD-10-CM

## 2021-08-03 NOTE — Assessment & Plan Note (Signed)
BP Readings from Last 3 Encounters:  ?08/03/21 133/64  ?01/30/21 (!) 144/71  ?11/07/20 123/78  ?Condition well-controlled, ?Continue enalapril 10 mg daily, propanolol 40 mg at bedtime.  ?DASH diet advised, regular exercise 30 minutes 5 t days a week encouraged ?Lab Results  ?Component Value Date  ? NA 142 07/31/2021  ? K 5.2 07/31/2021  ? CO2 25 07/31/2021  ? BUN 17 07/31/2021  ? CREATININE 0.76 07/31/2021  ? CALCIUM 9.7 07/31/2021  ? GLUCOSE 115 (H) 07/31/2021  ? ?

## 2021-08-03 NOTE — Assessment & Plan Note (Addendum)
Lab Results  ?Component Value Date  ? HGBA1C 6.1 (H) 07/31/2021  ?Not on medications ?Patient advised to increase intake of whole foods including vegetables and protein less carbs drink water instead of juice ? ?

## 2021-08-03 NOTE — Progress Notes (Signed)
? ?  Erin Griffin     MRN: [id number]      DOB: 1955/12/11 ? ? ?HPI ?Ms. Zaucha with medical history of hypertension, migraine, hyperlipidemia, prediabetes is here for follow up for her chronic medical conditions. ? ? ?There are no new concerns.  ?There are no specific complaints  ? ?Patient is due for shingles and flu vaccine, need for both vaccines discussed with patient.  Patient refused both vaccines today.  Patient states that she had colonoscopy done 4 years ago when she was with former PCP, patient states that last colonoscopy was normal.  ? ? ?ROS ?Denies recent fever or chills. ?Denies sinus pressure, nasal congestion, ear pain or sore throat. ?Denies chest congestion, productive cough or wheezing. ?Denies chest pains, palpitations and leg swelling ?Denies abdominal pain, nausea, vomiting,diarrhea or constipation.   ?Denies dysuria, frequency, hesitancy or incontinence. ?Denies joint pain, swelling and limitation in mobility. ?Denies headaches, seizures, numbness, or tingling. ?Denies depression, anxiety or insomnia. ? ? ? ?PE ? ?BP 133/64 (BP Location: Right Arm, Patient Position: Sitting, Cuff Size: Large)   Pulse 71   Ht 5\' 2"  (1.575 m)   Wt 181 lb (82.1 kg)   SpO2 100%   BMI 33.11 kg/m?  ? ?Patient alert and oriented and in no cardiopulmonary distress. ? ? ? ?Chest: Clear to auscultation bilaterally. ? ?CVS: S1, S2 no murmurs, no S3.Regular rate. ? ?ABD: Soft non tender.  ? ?Ext: No edema ? ?MS: Adequate ROM spine, shoulders, hips and knees. ? ?Psych: Good eye contact, normal affect. Memory intact not anxious or depressed appearing. ? ?CNS: CN 2-12 intact, power,  normal throughout.no focal deficits noted. ? ? ?Assessment & Plan ?Hypertension, essential ?BP Readings from Last 3 Encounters:  ?08/03/21 133/64  ?01/30/21 (!) 144/71  ?11/07/20 123/78  ?Condition well-controlled, ?Continue enalapril 10 mg daily, propanolol 40 mg at bedtime.  ?DASH diet advised, regular exercise 30 minutes 5 t days a week  encouraged ?Lab Results  ?Component Value Date  ? NA 142 07/31/2021  ? K 5.2 07/31/2021  ? CO2 25 07/31/2021  ? BUN 17 07/31/2021  ? CREATININE 0.76 07/31/2021  ? CALCIUM 9.7 07/31/2021  ? GLUCOSE 115 (H) 07/31/2021  ? ? ?Refused influenza vaccine ?Need for flu vaccine discussed with patient, patient refused flu vaccine today ? ?Prediabetes ?Lab Results  ?Component Value Date  ? HGBA1C 6.1 (H) 07/31/2021  ?Not on medications ?Patient advised to increase intake of whole foods including vegetables and protein less carbs drink water instead of juice ? ? ?Hyperlipidemia ?Lab Results  ?Component Value Date  ? CHOL 192 07/31/2021  ? HDL 58 07/31/2021  ? LDLCALC 109 (H) 07/31/2021  ? TRIG 142 07/31/2021  ?Takes simvastatin 20 mg daily, continue current medication. ?Avoid fried fatty foods increase exercise to lose weight. ?The 10-year ASCVD risk score (Arnett DK, et al., 2019) is: 6.9% ?  Values used to calculate the score: ?    Age: 66 years ?    Sex: Female ?    Is Non-Hispanic African American: No ?    Diabetic: No ?    Tobacco smoker: No ?    Systolic Blood Pressure: [id number] mmHg ?    Is BP treated: Yes ?    HDL Cholesterol: 58 mg/dL ?    Total Cholesterol: 192 mg/dL ? ? ? ? ?

## 2021-08-03 NOTE — Assessment & Plan Note (Signed)
Need for flu vaccine discussed with patient, patient refused flu vaccine today.  ?

## 2021-08-03 NOTE — Patient Instructions (Addendum)
Please get fasting labs done 3-5 days before your next visit.  ? ? ?It is important that you exercise regularly at least 30 minutes 5 times a week.  ?Think about what you will eat, plan ahead. ?Choose " clean, green, fresh or frozen" over canned, processed or packaged foods which are more sugary, salty and fatty. ?70 to 75% of food eaten should be vegetables and fruit. ?Three meals at set times with snacks allowed between meals, but they must be fruit or vegetables. ?Aim to eat over a 12 hour period , example 7 am to 7 pm, and STOP after  your last meal of the day. ?Drink water,generally about 64 ounces per day, no other drink is as healthy. Fruit juice is best enjoyed in a healthy way, by EATING the fruit. ? ?Thanks for choosing [facility], we consider it a privelige to serve you. ? ?

## 2021-08-03 NOTE — Assessment & Plan Note (Addendum)
Lab Results  ?Component Value Date  ? CHOL 192 07/31/2021  ? HDL 58 07/31/2021  ? LDLCALC 109 (H) 07/31/2021  ? TRIG 142 07/31/2021  ?Takes simvastatin 20 mg daily, continue current medication. ?Avoid fried fatty foods increase exercise to lose weight. ?The 10-year ASCVD risk score (Arnett DK, et al., 2019) is: 6.9% ?  Values used to calculate the score: ?    Age: 66 years ?    Sex: Female ?    Is Non-Hispanic African American: No ?    Diabetic: No ?    Tobacco smoker: No ?    Systolic Blood Pressure: 133 mmHg ?    Is BP treated: Yes ?    HDL Cholesterol: 58 mg/dL ?    Total Cholesterol: 192 mg/dL ? ? ?

## 2021-08-09 ENCOUNTER — Ambulatory Visit (HOSPITAL_COMMUNITY)
Admission: RE | Admit: 2021-08-09 | Discharge: 2021-08-09 | Disposition: A | Discharge status: Home / Self Care | Payer: BC Managed Care – PPO | Source: Ambulatory Visit | Attending: Nurse Practitioner | Admitting: Nurse Practitioner

## 2021-08-09 ENCOUNTER — Other Ambulatory Visit: Payer: Self-pay

## 2021-08-09 DIAGNOSIS — Z1231 Encounter for screening mammogram for malignant neoplasm of breast: Secondary | ICD-10-CM | POA: Diagnosis not present

## 2021-08-09 NOTE — Progress Notes (Signed)
Normal mammogram, repeat in 1 year .please discussed results with patient thank you

## 2021-08-16 ENCOUNTER — Telehealth: Payer: Self-pay | Admitting: Nurse Practitioner

## 2021-08-16 NOTE — Telephone Encounter (Signed)
Patient returning missed calls. ? ?Patient states they will be out town until later this evening. Can call patient back tomorrow.  ?

## 2021-08-18 NOTE — Telephone Encounter (Signed)
Called pt no answer left vm 

## 2021-08-18 NOTE — Telephone Encounter (Signed)
Pt called back advised of results.

## 2021-10-23 ENCOUNTER — Ambulatory Visit
Admission: EM | Admit: 2021-10-23 | Discharge: 2021-10-23 | Disposition: A | Discharge status: Home / Self Care | Payer: Medicare Other | Attending: Nurse Practitioner | Admitting: Nurse Practitioner

## 2021-10-23 ENCOUNTER — Ambulatory Visit: Payer: Self-pay

## 2021-10-23 ENCOUNTER — Encounter: Payer: Self-pay | Admitting: Emergency Medicine

## 2021-10-23 DIAGNOSIS — R21 Rash and other nonspecific skin eruption: Secondary | ICD-10-CM | POA: Diagnosis not present

## 2021-10-23 MED ORDER — VALACYCLOVIR HCL 1 G PO TABS: 1000.0000 mg | ORAL_TABLET | Freq: Three times a day (TID) | ORAL | 0 refills | Status: AC

## 2021-10-23 NOTE — Discharge Instructions (Signed)
Take medication as prescribed. Avoid scratching or irritating the areas while symptoms persist. May take Benadryl at bedtime to help with pain, itching, or discomfort. May apply cool cloths to the areas to help with pain or discomfort. Follow-up if symptoms worsen or do not improve.

## 2021-10-23 NOTE — ED Triage Notes (Signed)
Rash on back that is painful x a few days.

## 2021-10-23 NOTE — ED Provider Notes (Signed)
[facility]    CSN: [id number] Arrival date & time: 10/23/21  1132      History   Chief Complaint No chief complaint on file.   HPI Erin Griffin is a 66 y.o. female.   The patient is a 66 year old female who presents with a rash to the mid right back.  Symptoms started 2 to 3 days ago.  Patient states the area    Past Medical History:  Diagnosis Date   Heart murmur    Phreesia 06/19/2020   Hyperlipidemia    Phreesia 06/19/2020   Hypertension    Phreesia 06/19/2020   Migraine    no aura    Patient Active Problem List   Diagnosis Date Noted   Refused influenza vaccine 08/03/2021   Encounter for screening mammogram for malignant neoplasm of breast 08/03/2021   Prediabetes 01/30/2021   BPPV (benign paroxysmal positional vertigo) 11/07/2020   Hypertension, essential 06/28/2020   Hyperlipidemia 06/28/2020   Migraine 06/28/2020   Encounter for general adult medical examination with abnormal findings 06/28/2020   Screening due 06/28/2020    Past Surgical History:  Procedure Laterality Date   APPENDECTOMY N/A    Phreesia 06/19/2020   OVARIAN CYST REMOVAL Right     OB History   No obstetric history on file.      Home Medications    Prior to Admission medications   Medication Sig Start Date End Date Taking? Authorizing Provider  eletriptan (RELPAX) 20 MG tablet Take 20 mg by mouth as needed for migraine or headache. May repeat in 2 hours if headache persists or recurs.    [provider]  enalapril (VASOTEC) 10 MG tablet Take 1 tablet (10 mg total) by mouth daily. 07/10/21   Donell Beers, FNP  propranolol (INDERAL) 40 MG tablet Take 1 tablet (40 mg total) by mouth at bedtime. 08/01/21   Donell Beers, FNP  simvastatin (ZOCOR) 20 MG tablet Take 1 tablet (20 mg total) by mouth daily. 07/10/21   Donell Beers, FNP    Family History Family History  Problem Relation Age of Onset   Breast cancer Mother    Breast cancer Sister      Social History Social History   Tobacco Use   Smoking status: Never   Smokeless tobacco: Never  Vaping Use   Vaping Use: Never used  Substance Use Topics   Alcohol use: Not Currently    Alcohol/week: 2.0 standard drinks    Types: 2 Glasses of wine per week    Comment: per week    Drug use: Never     Allergies   Shellfish allergy   Review of Systems Review of Systems   Physical Exam Triage Vital Signs ED Triage Vitals [10/23/21 1216]  Enc Vitals Group     BP 132/80     Pulse Rate 66     Resp 18     Temp 98.3 F (36.8 C)     Temp Source Oral     SpO2 96 %     Weight      Height      Head Circumference      Peak Flow      Pain Score 5     Pain Loc      Pain Edu?      Excl. in GC?    No data found.  Updated Vital Signs BP 132/80 (BP Location: Right Arm)   Pulse 66   Temp 98.3 F (36.8 C) (Oral)  Resp 18   SpO2 96%   Visual Acuity Right Eye Distance:   Left Eye Distance:   Bilateral Distance:    Right Eye Near:   Left Eye Near:    Bilateral Near:     Physical Exam   UC Treatments / Results  Labs (all labs ordered are listed, but only abnormal results are displayed) Labs Reviewed - No data to display  EKG   Radiology No results found.  Procedures Procedures (including critical care time)  Medications Ordered in UC Medications - No data to display  Initial Impression / Assessment and Plan / UC Course  I have reviewed the triage vital signs and the nursing notes.  Pertinent labs & imaging results that were available during my care of the patient were reviewed by me and considered in my medical decision making (see chart for details).     *** Final Clinical Impressions(s) / UC Diagnoses   Final diagnoses:  None   Discharge Instructions   None    ED Prescriptions   None    PDMP not reviewed this encounter.

## 2021-10-27 ENCOUNTER — Other Ambulatory Visit: Payer: Self-pay | Admitting: Nurse Practitioner

## 2021-10-27 DIAGNOSIS — E785 Hyperlipidemia, unspecified: Secondary | ICD-10-CM

## 2021-10-27 DIAGNOSIS — I10 Essential (primary) hypertension: Secondary | ICD-10-CM

## 2021-11-01 ENCOUNTER — Telehealth: Payer: Medicare Other | Admitting: Physician Assistant

## 2021-11-01 DIAGNOSIS — B029 Zoster without complications: Secondary | ICD-10-CM | POA: Diagnosis not present

## 2021-11-01 MED ORDER — GABAPENTIN 300 MG PO CAPS: 300.0000 mg | ORAL_CAPSULE | Freq: Two times a day (BID) | ORAL | 0 refills | Status: DC

## 2021-11-01 MED ORDER — VALACYCLOVIR HCL 1 G PO TABS
1000.0000 mg | ORAL_TABLET | Freq: Three times a day (TID) | ORAL | 0 refills | Status: AC
Start: 2021-11-01 — End: 2021-11-08

## 2021-11-01 NOTE — Progress Notes (Signed)
E-visit for Shingles   We are sorry that you are not feeling well. Here is how we plan to help!  Based on what you shared with me it looks like you have shingles.  Shingles or herpes zoster, is a common infection of the nerves.  It is a painful rash caused by the herpes zoster virus.  This is the same virus that causes chickenpox.  After a person has chickenpox, the virus remains inactive in the nerve cells.  Years later, the virus can become active again and travel to the skin.  It typically will appear on one side of the face or body.  Burning or shooting pain, tingling, or itching are early signs of the infection.  Blisters typically scab over in 7 to 10 days and clear up within 2-4 weeks. Shingles is only contagious to people that have never had the chickenpox, the chickenpox vaccine, or anyone who has a compromised immune system.  You should avoid contact with these type of people until your blisters scab over.  I have prescribed Valacyclovir 1g three times daily for 7 days and also Gabapentin 300mg twice daily as needed for pain   HOME CARE: Apply ice packs (wrapped in a thin towel), cool compresses, or soak in cool bath to help reduce pain. Use calamine lotion to calm itchy skin. Avoid scratching the rash. Avoid direct sunlight.  GET HELP RIGHT AWAY IF: Symptoms that don't away after treatment. A rash or blisters near your eye. Increased drainage, fever, or rash after treatment. Severe pain that doesn't go away.   MAKE SURE YOU   Understand these instructions. Will watch your condition. Will get help right away if you are not doing well or get worse.  Thank you for choosing an e-visit.  Your e-visit answers were reviewed by a board certified advanced clinical practitioner to complete your personal care plan. Depending upon the condition, your plan could have included both over the counter or prescription medications.  Please review your pharmacy choice. Make sure the pharmacy  is open so you can pick up prescription now. If there is a problem, you may contact your provider through MyChart messaging and have the prescription routed to another pharmacy.  Your safety is important to us. If you have drug allergies check your prescription carefully.   For the next 24 hours you can use MyChart to ask questions about today's visit, request a non-urgent call back, or ask for a work or school excuse. You will get an email in the next two days asking about your experience. I hope that your e-visit has been valuable and will speed your recovery.  I provided 5 minutes of non face-to-face time during this encounter for chart review and documentation.   

## 2021-12-13 ENCOUNTER — Encounter: Payer: Self-pay | Admitting: Nurse Practitioner

## 2021-12-14 ENCOUNTER — Other Ambulatory Visit: Payer: Self-pay | Admitting: Nurse Practitioner

## 2021-12-14 MED ORDER — ELETRIPTAN HYDROBROMIDE 20 MG PO TABS
20.0000 mg | ORAL_TABLET | ORAL | 0 refills | Status: DC | PRN
Start: 2021-12-14 — End: 2022-02-09

## 2022-01-25 ENCOUNTER — Other Ambulatory Visit: Payer: Self-pay | Admitting: Nurse Practitioner

## 2022-01-25 DIAGNOSIS — I10 Essential (primary) hypertension: Secondary | ICD-10-CM

## 2022-01-25 DIAGNOSIS — E785 Hyperlipidemia, unspecified: Secondary | ICD-10-CM

## 2022-02-02 ENCOUNTER — Encounter: Payer: BC Managed Care – PPO | Admitting: Nurse Practitioner

## 2022-02-09 ENCOUNTER — Other Ambulatory Visit: Payer: Self-pay | Admitting: Nurse Practitioner

## 2022-02-09 MED ORDER — ELETRIPTAN HYDROBROMIDE 20 MG PO TABS: 20.0000 mg | ORAL_TABLET | ORAL | 0 refills | Status: DC | PRN

## 2022-02-15 ENCOUNTER — Encounter: Payer: BC Managed Care – PPO | Admitting: Nurse Practitioner

## 2022-02-21 LAB — CMP14+EGFR
ALT: 15 IU/L (ref 0–32)
AST: 14 IU/L (ref 0–40)
Albumin/Globulin Ratio: 2.1 (ref 1.2–2.2)
Albumin: 4.4 g/dL (ref 3.9–4.9)
Alkaline Phosphatase: 104 IU/L (ref 44–121)
BUN/Creatinine Ratio: 25 (ref 12–28)
BUN: 21 mg/dL (ref 8–27)
Bilirubin Total: 0.6 mg/dL (ref 0.0–1.2)
CO2: 24 mmol/L (ref 20–29)
Calcium: 9.4 mg/dL (ref 8.7–10.3)
Chloride: 103 mmol/L (ref 96–106)
Creatinine, Ser: 0.83 mg/dL (ref 0.57–1.00)
Globulin, Total: 2.1 g/dL (ref 1.5–4.5)
Glucose: 115 mg/dL — ABNORMAL HIGH (ref 70–99)
Potassium: 4.9 mmol/L (ref 3.5–5.2)
Sodium: 141 mmol/L (ref 134–144)
Total Protein: 6.5 g/dL (ref 6.0–8.5)
eGFR: 78 mL/min/{1.73_m2} (ref >59)

## 2022-02-21 LAB — CBC WITH DIFFERENTIAL/PLATELET
Basophils Absolute: 0.1 10*3/uL (ref 0.0–0.2)
Basos: 1 %
EOS (ABSOLUTE): 0.2 10*3/uL (ref 0.0–0.4)
Eos: 3 %
Hematocrit: 40.4 % (ref 34.0–46.6)
Hemoglobin: 13.8 g/dL (ref 11.1–15.9)
Immature Grans (Abs): 0 10*3/uL (ref 0.0–0.1)
Immature Granulocytes: 0 %
Lymphocytes Absolute: 2 10*3/uL (ref 0.7–3.1)
Lymphs: 34 %
MCH: 32.2 pg (ref 26.6–33.0)
MCHC: 34.2 g/dL (ref 31.5–35.7)
MCV: 94 fL (ref 79–97)
Monocytes Absolute: 0.5 10*3/uL (ref 0.1–0.9)
Monocytes: 8 %
Neutrophils Absolute: 3.2 10*3/uL (ref 1.4–7.0)
Neutrophils: 54 %
Platelets: 238 10*3/uL (ref 150–450)
RBC: 4.28 x10E6/uL (ref 3.77–5.28)
RDW: 12.2 % (ref 11.7–15.4)
WBC: 5.9 10*3/uL (ref 3.4–10.8)

## 2022-02-21 LAB — TSH: TSH: 1.76 u[IU]/mL (ref 0.450–4.500)

## 2022-02-21 LAB — LIPID PANEL
Chol/HDL Ratio: 3.9 ratio (ref 0.0–4.4)
Cholesterol, Total: 182 mg/dL (ref 100–199)
HDL: 47 mg/dL (ref >39)
LDL Chol Calc (NIH): 107 mg/dL — ABNORMAL HIGH (ref 0–99)
Triglycerides: 157 mg/dL — ABNORMAL HIGH (ref 0–149)
VLDL Cholesterol Cal: 28 mg/dL (ref 5–40)

## 2022-02-21 LAB — HEMOGLOBIN A1C
Est. average glucose Bld gHb Est-mCnc: 126 mg/dL
Hgb A1c MFr Bld: 6 % — ABNORMAL HIGH (ref 4.8–5.6)

## 2022-02-21 LAB — VITAMIN D 25 HYDROXY (VIT D DEFICIENCY, FRACTURES): Vit D, 25-Hydroxy: 35.1 ng/mL (ref 30.0–100.0)

## 2022-02-23 ENCOUNTER — Encounter: Payer: Self-pay | Admitting: Nurse Practitioner

## 2022-02-23 ENCOUNTER — Ambulatory Visit (INDEPENDENT_AMBULATORY_CARE_PROVIDER_SITE_OTHER): Payer: Medicare Other | Admitting: Nurse Practitioner

## 2022-02-23 VITALS — BP 124/81 | HR 71 | Ht 62.0 in | Wt 183.0 lb

## 2022-02-23 DIAGNOSIS — E782 Mixed hyperlipidemia: Secondary | ICD-10-CM | POA: Diagnosis not present

## 2022-02-23 DIAGNOSIS — R7303 Prediabetes: Secondary | ICD-10-CM

## 2022-02-23 DIAGNOSIS — E669 Obesity, unspecified: Secondary | ICD-10-CM | POA: Insufficient documentation

## 2022-02-23 DIAGNOSIS — Z780 Asymptomatic menopausal state: Secondary | ICD-10-CM | POA: Diagnosis not present

## 2022-02-23 DIAGNOSIS — G43809 Other migraine, not intractable, without status migrainosus: Secondary | ICD-10-CM

## 2022-02-23 DIAGNOSIS — I10 Essential (primary) hypertension: Secondary | ICD-10-CM | POA: Diagnosis not present

## 2022-02-23 NOTE — Assessment & Plan Note (Addendum)
Wt Readings from Last 3 Encounters:  02/23/22 183 lb (83 kg)  08/03/21 181 lb (82.1 kg)  01/30/21 180 lb (81.6 kg)   Has gained 2lbs since last visit. Patient counseled on low-carb diet encouraged to engage in regular moderate exercise with at least 150 minutes weekly.

## 2022-02-23 NOTE — Progress Notes (Signed)
Established Patient Office Visit  Subjective:  Patient ID: Erin Griffin, female    DOB: February 22, 1956  Age: 66 y.o. MRN: [id number]  CC:  Chief Complaint  Patient presents with   Annual Exam    cpe    HPI Erin Griffin is a 66 y.o. female with past medical history of hypertension, hyperlipidemia, prediabetes, vertigo, migraine presents for follow-up for her chronic medical conditions   Hypertension.  Currently on enalapril 10 mg daily, propanolol 40 mg daily.  Patient denies dizziness, headaches, syncope.    Hyperlipidemia.  Currently on simvastatin 20 mg daily.  She denies muscle aches.   Obesity . Goes to the gymn twice an hour each time, she has been eating more or vegetables and fruits.   Due for shingles vaccine and pneumonia vaccines flu vaccine need for all vaccines discussed with the patient patient encouraged to get the vaccines.    Past Medical History:  Diagnosis Date   Heart murmur    Phreesia 06/19/2020   Hyperlipidemia    Phreesia 06/19/2020   Hypertension    Phreesia 06/19/2020   Migraine    no aura    Past Surgical History:  Procedure Laterality Date   APPENDECTOMY N/A    Phreesia 06/19/2020   OVARIAN CYST REMOVAL Right     Family History  Problem Relation Age of Onset   Breast cancer Mother    Breast cancer Sister     Social History   Socioeconomic History   Marital status: Married    Spouse name: Not on file   Number of children: Not on file   Years of education: Not on file   Highest education level: Not on file  Occupational History   Occupation: Retired  Tobacco Use   Smoking status: Never   Smokeless tobacco: Never  Vaping Use   Vaping Use: Never used  Substance and Sexual Activity   Alcohol use: Yes    Alcohol/week: 2.0 standard drinks of alcohol    Types: 2 Glasses of wine per week    Comment: per week    Drug use: Never   Sexual activity: Yes  Other Topics Concern   Not on file  Social History Narrative   Not on file    Social Determinants of Health   Financial Resource Strain: Not on file  Food Insecurity: Not on file  Transportation Needs: Not on file  Physical Activity: Not on file  Stress: Not on file  Social Connections: Not on file  Intimate Partner Violence: Not on file    Outpatient Medications Prior to Visit  Medication Sig Dispense Refill   eletriptan (RELPAX) 20 MG tablet Take 1 tablet (20 mg total) by mouth as needed for migraine or headache. May repeat in 2 hours if headache persists or recurs. 10 tablet 0   enalapril (VASOTEC) 10 MG tablet TAKE 1 TABLET(10 MG) BY MOUTH DAILY 30 tablet 3   propranolol (INDERAL) 40 MG tablet TAKE 1 TABLET(40 MG) BY MOUTH AT BEDTIME 90 tablet 1   simvastatin (ZOCOR) 20 MG tablet TAKE 1 TABLET(20 MG) BY MOUTH DAILY 30 tablet 3   gabapentin (NEURONTIN) 300 MG capsule Take 1 capsule (300 mg total) by mouth 2 (two) times daily. Do not drive while taking due to drowsiness side effects 20 capsule 0   No facility-administered medications prior to visit.    Allergies  Allergen Reactions   Shellfish Allergy Hives, Itching and Swelling    ROS Review of Systems  Constitutional: Negative.  Negative  for activity change, appetite change, chills, diaphoresis and fatigue.  HENT: Negative.  Negative for congestion, dental problem, ear discharge, ear pain and facial swelling.   Eyes: Negative.  Negative for pain, discharge and itching.  Respiratory: Negative.  Negative for apnea, cough, choking and chest tightness.   Cardiovascular: Negative.  Negative for chest pain, palpitations and leg swelling.  Gastrointestinal: Negative.  Negative for abdominal distention, abdominal pain, anal bleeding and blood in stool.  Endocrine: Negative.  Negative for cold intolerance, heat intolerance and polydipsia.  Genitourinary: Negative.  Negative for difficulty urinating, dysuria, flank pain and frequency.  Musculoskeletal: Negative.  Negative for arthralgias, back pain and gait  problem.  Skin: Negative.  Negative for color change, pallor and rash.  Allergic/Immunologic: Negative.  Negative for environmental allergies, food allergies and immunocompromised state.  Neurological: Negative.  Negative for dizziness, seizures, facial asymmetry, light-headedness and numbness.  Hematological: Negative.  Negative for adenopathy. Does not bruise/bleed easily.  Psychiatric/Behavioral: Negative.  Negative for agitation, behavioral problems, confusion and decreased concentration.       Objective:    Physical Exam Constitutional:      General: She is not in acute distress.    Appearance: Normal appearance. She is obese. She is not ill-appearing, toxic-appearing or diaphoretic.  HENT:     Head: Normocephalic and atraumatic.     Right Ear: Tympanic membrane, ear canal and external ear normal. There is no impacted cerumen.     Left Ear: Tympanic membrane, ear canal and external ear normal. There is no impacted cerumen.     Nose: No congestion or rhinorrhea.     Mouth/Throat:     Mouth: Mucous membranes are moist.     Pharynx: Oropharynx is clear. No oropharyngeal exudate or posterior oropharyngeal erythema.  Eyes:     General: No scleral icterus.       Right eye: No discharge.        Left eye: No discharge.     Extraocular Movements: Extraocular movements intact.     Conjunctiva/sclera: Conjunctivae normal.     Pupils: Pupils are equal, round, and reactive to light.  Neck:     Vascular: No carotid bruit.  Cardiovascular:     Rate and Rhythm: Normal rate and regular rhythm.     Pulses: Normal pulses.     Heart sounds: Normal heart sounds. No murmur heard.    No friction rub. No gallop.  Pulmonary:     Effort: Pulmonary effort is normal. No respiratory distress.     Breath sounds: Normal breath sounds. No stridor. No wheezing, rhonchi or rales.  Chest:     Chest wall: No tenderness.  Abdominal:     General: There is no distension.     Palpations: Abdomen is soft.  There is no mass.     Tenderness: There is no abdominal tenderness. There is no right CVA tenderness, left CVA tenderness or guarding.     Hernia: No hernia is present.  Musculoskeletal:        General: No swelling, tenderness, deformity or signs of injury.     Cervical back: Normal range of motion and neck supple. No rigidity or tenderness.     Right lower leg: No edema.     Left lower leg: No edema.     Comments: Varicose veins noted. No erythema, tenderness or swelling noted   Lymphadenopathy:     Cervical: No cervical adenopathy.  Skin:    General: Skin is warm and dry.  Capillary Refill: Capillary refill takes less than 2 seconds.     Coloration: Skin is not jaundiced or pale.     Findings: No bruising, erythema, lesion or rash.  Neurological:     Mental Status: She is alert and oriented to person, place, and time.     Cranial Nerves: No cranial nerve deficit.     Sensory: No sensory deficit.     Motor: No weakness.     Coordination: Coordination normal.     Gait: Gait normal.     Deep Tendon Reflexes: Reflexes normal.  Psychiatric:        Mood and Affect: Mood normal.        Behavior: Behavior normal.        Thought Content: Thought content normal.        Judgment: Judgment normal.     BP 124/81 (BP Location: Left Arm, Patient Position: Sitting, Cuff Size: Large)   Pulse 71   Ht 5\' 2"  (1.575 m)   Wt 183 lb (83 kg)   SpO2 97%   BMI 33.47 kg/m  Wt Readings from Last 3 Encounters:  02/23/22 183 lb (83 kg)  08/03/21 181 lb (82.1 kg)  01/30/21 180 lb (81.6 kg)    Lab Results  Component Value Date   TSH 1.760 02/20/2022   Lab Results  Component Value Date   WBC 5.9 02/20/2022   HGB 13.8 02/20/2022   HCT 40.4 02/20/2022   MCV 94 02/20/2022   PLT 238 02/20/2022   Lab Results  Component Value Date   NA 141 02/20/2022   K 4.9 02/20/2022   CO2 24 02/20/2022   GLUCOSE 115 (H) 02/20/2022   BUN 21 02/20/2022   CREATININE 0.83 02/20/2022   BILITOT 0.6  02/20/2022   ALKPHOS 104 02/20/2022   AST 14 02/20/2022   ALT 15 02/20/2022   PROT 6.5 02/20/2022   ALBUMIN 4.4 02/20/2022   CALCIUM 9.4 02/20/2022   EGFR 78 02/20/2022   Lab Results  Component Value Date   CHOL 182 02/20/2022   Lab Results  Component Value Date   HDL 47 02/20/2022   Lab Results  Component Value Date   LDLCALC 107 (H) 02/20/2022   Lab Results  Component Value Date   TRIG 157 (H) 02/20/2022   Lab Results  Component Value Date   CHOLHDL 3.9 02/20/2022   Lab Results  Component Value Date   HGBA1C 6.0 (H) 02/20/2022      Assessment & Plan:   Problem List Items Addressed This Visit       Cardiovascular and Mediastinum   Hypertension, essential - Primary    BP Readings from Last 3 Encounters:  02/23/22 124/81  10/23/21 132/80  08/03/21 133/64  Chronic medical condition well-controlled on enalapril 10 mg daily, propanolol 40 mg daily Continue current medications DASH diet advised patient encouraged to engage in regular moderate exercises at least 150 minutes weekly Follow-up in 6 months      Migraine    Condition well-controlled On relpax 20 mg as needed Propanolol 40 mg daily Continue current medications        Other   Hyperlipidemia    Last lipids Lab Results  Component Value Date   CHOL 182 02/20/2022   HDL 47 02/20/2022   LDLCALC 107 (H) 02/20/2022   TRIG 157 (H) 02/20/2022   CHOLHDL 3.9 02/20/2022  Currently on simvastatin 20 mg daily Continue current medication Avoid fatty fried foods      Prediabetes  Lab Results  Component Value Date   HGBA1C 6.0 (H) 02/20/2022  avoid sugar, sweets, soda.        Obesity (BMI 30-39.9)    Wt Readings from Last 3 Encounters:  02/23/22 183 lb (83 kg)  08/03/21 181 lb (82.1 kg)  01/30/21 180 lb (81.6 kg)  Has gained 2lbs since last visit. Patient counseled on low-carb diet encouraged to engage in regular moderate exercise with at least 150 minutes weekly.       Other Visit  Diagnoses     Post-menopausal       Relevant Orders   DG Bone Density       No orders of the defined types were placed in this encounter.   Follow-up: Return in about 6 months (around 08/24/2022) for HTN/HLD.    Renee Rival, FNP

## 2022-02-23 NOTE — Assessment & Plan Note (Addendum)
Last lipids Lab Results  Component Value Date   CHOL 182 02/20/2022   HDL 47 02/20/2022   LDLCALC 107 (H) 02/20/2022   TRIG 157 (H) 02/20/2022   CHOLHDL 3.9 02/20/2022  Currently on simvastatin 20 mg daily Continue current medication Avoid fatty fried foods

## 2022-02-23 NOTE — Assessment & Plan Note (Signed)
Lab Results  Component Value Date   HGBA1C 6.0 (H) 02/20/2022  avoid sugar, sweets, soda.

## 2022-02-23 NOTE — Patient Instructions (Signed)
Please consider getting your flu vaccine, shingles vaccine and pneumonia vaccine    It is important that you exercise regularly at least 30 minutes 5 times a week.  Think about what you will eat, plan ahead. Choose " clean, green, fresh or frozen" over canned, processed or packaged foods which are more sugary, salty and fatty. 70 to 75% of food eaten should be vegetables and fruit. Three meals at set times with snacks allowed between meals, but they must be fruit or vegetables. Aim to eat over a 12 hour period , example 7 am to 7 pm, and STOP after  your last meal of the day. Drink water,generally about 64 ounces per day, no other drink is as healthy. Fruit juice is best enjoyed in a healthy way, by EATING the fruit.  Thanks for choosing [facility], we consider it a privelige to serve you.

## 2022-02-23 NOTE — Assessment & Plan Note (Addendum)
BP Readings from Last 3 Encounters:  02/23/22 124/81  10/23/21 132/80  08/03/21 133/64  Chronic medical condition well-controlled on enalapril 10 mg daily, propanolol 40 mg daily Continue current medications DASH diet advised patient encouraged to engage in regular moderate exercises at least 150 minutes weekly Follow-up in 6 months

## 2022-02-23 NOTE — Assessment & Plan Note (Signed)
Condition well-controlled On relpax 20 mg as needed Propanolol 40 mg daily Continue current medications

## 2022-03-02 ENCOUNTER — Ambulatory Visit (HOSPITAL_COMMUNITY)
Admission: RE | Admit: 2022-03-02 | Discharge: 2022-03-02 | Disposition: A | Discharge status: Home / Self Care | Payer: Medicare Other | Source: Ambulatory Visit | Attending: Nurse Practitioner | Admitting: Nurse Practitioner

## 2022-03-02 DIAGNOSIS — Z780 Asymptomatic menopausal state: Secondary | ICD-10-CM | POA: Insufficient documentation

## 2022-03-02 NOTE — Progress Notes (Signed)
Normal DEXA SCAN

## 2022-03-26 ENCOUNTER — Encounter: Payer: Self-pay | Admitting: Internal Medicine

## 2022-03-26 ENCOUNTER — Ambulatory Visit (INDEPENDENT_AMBULATORY_CARE_PROVIDER_SITE_OTHER): Payer: Medicare Other | Admitting: Internal Medicine

## 2022-03-26 VITALS — BP 132/78 | HR 73 | Ht 62.0 in | Wt 183.1 lb

## 2022-03-26 DIAGNOSIS — Z0000 Encounter for general adult medical examination without abnormal findings: Secondary | ICD-10-CM

## 2022-03-26 DIAGNOSIS — G43809 Other migraine, not intractable, without status migrainosus: Secondary | ICD-10-CM | POA: Diagnosis not present

## 2022-03-26 MED ORDER — VENLAFAXINE HCL ER 37.5 MG PO CP24
37.5000 mg | ORAL_CAPSULE | Freq: Every day | ORAL | 1 refills | Status: DC
Start: 2022-03-26 — End: 2022-04-16

## 2022-03-26 NOTE — Progress Notes (Signed)
Subjective:    Erin Griffin is a 66 y.o. female who presents for a Welcome to Medicare exam.   Review of Systems Review of Systems  Neurological:  Positive for headaches.  All other systems reviewed and are negative.    Objective:    Today's Vitals   03/26/22 0806  BP: 132/78  Pulse: 73  SpO2: 96%  Weight: 183 lb 1.9 oz (83.1 kg)  Height: 5\' 2"  (1.575 m)  PainSc: 3   PainLoc: Head  Body mass index is 33.49 kg/m.  Medications Outpatient Encounter Medications as of 03/26/2022  Medication Sig   eletriptan (RELPAX) 20 MG tablet Take 1 tablet (20 mg total) by mouth as needed for migraine or headache. May repeat in 2 hours if headache persists or recurs.   enalapril (VASOTEC) 10 MG tablet TAKE 1 TABLET(10 MG) BY MOUTH DAILY   propranolol (INDERAL) 40 MG tablet TAKE 1 TABLET(40 MG) BY MOUTH AT BEDTIME   simvastatin (ZOCOR) 20 MG tablet TAKE 1 TABLET(20 MG) BY MOUTH DAILY   No facility-administered encounter medications on file as of 03/26/2022.     History: Past Medical History:  Diagnosis Date   Heart murmur    Phreesia 06/19/2020   Hyperlipidemia    Phreesia 06/19/2020   Hypertension    Phreesia 06/19/2020   Migraine    no aura   Past Surgical History:  Procedure Laterality Date   APPENDECTOMY N/A    Phreesia 06/19/2020   OVARIAN CYST REMOVAL Right     Family History  Problem Relation Age of Onset   Breast cancer Mother    Breast cancer Sister    Social History   Occupational History   Occupation: Retired  Tobacco Use   Smoking status: Never   Smokeless tobacco: Never  Vaping Use   Vaping Use: Never used  Substance and Sexual Activity   Alcohol use: Yes    Alcohol/week: 2.0 standard drinks of alcohol    Types: 2 Glasses of wine per week    Comment: per week    Drug use: Never   Sexual activity: Yes    Tobacco Counseling Counseling given: Not Answered   Immunizations and Health Maintenance Immunization History  Administered Date(s)  Administered   Tdap 12/09/2017   Health Maintenance Due  Topic Date Due   Zoster Vaccines- Shingrix (1 of 2) Never done   Pneumonia Vaccine 42+ Years old (1 - PCV) Never done    Activities of Daily Living     No data to display          Physical Exam   Physical Exam Constitutional:      General: She is not in acute distress.    Appearance: She is obese.  Cardiovascular:     Rate and Rhythm: Normal rate and regular rhythm.     Heart sounds: No murmur heard. Pulmonary:     Effort: Pulmonary effort is normal.     Breath sounds: Normal breath sounds.      Advanced Directives: Does Patient Have a Medical Advance Directive?: Yes Does patient want to make changes to medical advance directive?: Yes (MAU/Ambulatory/Procedural Areas - Information given)    Assessment:    This is a routine wellness examination for this patient .  77  Vision/Hearing screen No results found.  Dietary issues and exercise activities discussed:      Goals   None    Depression Screen    03/26/2022    8:19 AM 03/26/2022    8:07  AM 02/23/2022    1:08 PM 08/03/2021   11:02 AM  PHQ 2/9 Scores  PHQ - 2 Score 0 0 0 0   Vision Works  Fall Risk    03/26/2022    8:19 AM  [place] in the past year? 0  Number falls in past yr: 0  Injury with Fall? 0    Cognitive Function:        03/26/2022    8:19 AM  6CIT Screen  What Year? 0 points  What month? 0 points  What time? 0 points  Count back from 20 0 points  Months in reverse 0 points  Repeat phrase 2 points  Total Score 2 points    Patient Care Team: Alvira Monday, FNP as PCP - General (Family Medicine)     Plan:  History of migraines since a teenager. Having a migraine every week to 2 weeks. She can go 2 to 3 weeks without a migraine, but after having one they reoccur for a few days. Propanolol tried, but has not changed her headache frequency. Patient has been taking relpax once a week, but tries to avoid using  if possible.   Assessment/Plan: Migraines , uncontrolled on current preventive therapy. Stop Propanol. Start Effexor. Will see in 2 weeks and adjust dose if needed. Continue Relpax for abortive therapy  Patient declined Zosters and Pneumonia vaccines.   I have personally reviewed and noted the following in the patient's chart:   Medical and social history Use of alcohol, tobacco or illicit drugs  Current medications and supplements Functional ability and status Nutritional status Physical activity Advanced directives List of other physicians Hospitalizations, surgeries, and ER visits in previous 12 months Vitals Screenings to include cognitive, depression, and falls Referrals and appointments  In addition, I have reviewed and discussed with patient certain preventive protocols, quality metrics, and best practice recommendations. A written personalized care plan for preventive services as well as general preventive health recommendations were provided to patient.     Lorene Dy, MD 03/26/2022

## 2022-03-26 NOTE — Assessment & Plan Note (Signed)
History of migraines since a teenager. Having a migraine every week to 2 weeks. She can go 2 to 3 weeks without a migraine, but after having one they reoccur for a few days. Propanolol tried, but has not changed her headache frequency. Patient has been taking relpax once a week, but tries to avoid using if possible.   Assessment/Plan: Migraines , uncontrolled on current preventive therapy. Stop Propanol. Start Effexor. Will see in 2 weeks and adjust dose if needed. Continue Relpax for abortive therapy.

## 2022-03-26 NOTE — Patient Instructions (Signed)
  Erin Griffin , Thank you for taking time to come for your Medicare Wellness Visit. I appreciate your ongoing commitment to your health goals. Please review the following plan we discussed and let me know if I can assist you in the future.   Thank you for trusting me with your care. To recap, today we discussed the following:   Migraines - Stop propanolol - venlafaxine XR (EFFEXOR XR) 37.5 MG 24 hr capsule; Take 1 capsule (37.5 mg total) by mouth daily with breakfast.  Dispense: 30 capsule; Refill: 1 - Follow up in 2 weeks    This is a list of the screening recommended for you and due dates:  Health Maintenance  Topic Date Due   Zoster (Shingles) Vaccine (1 of 2) Never done   Pneumonia Vaccine (1 - PCV) Never done   Pap Smear  01/31/2023   Mammogram  08/10/2023   Colon Cancer Screening  09/08/2025   Tetanus Vaccine  12/10/2027   DEXA scan (bone density measurement)  Completed   Hepatitis C Screening: USPSTF Recommendation to screen - Ages 50-79 yo.  Completed   HIV Screening  Completed   HPV Vaccine  Aged Out   Flu Shot  Discontinued   COVID-19 Vaccine  Discontinued

## 2022-04-16 ENCOUNTER — Encounter: Payer: Self-pay | Admitting: Internal Medicine

## 2022-04-16 ENCOUNTER — Ambulatory Visit (INDEPENDENT_AMBULATORY_CARE_PROVIDER_SITE_OTHER): Payer: Medicare Other | Admitting: Internal Medicine

## 2022-04-16 DIAGNOSIS — G43809 Other migraine, not intractable, without status migrainosus: Secondary | ICD-10-CM | POA: Diagnosis not present

## 2022-04-16 MED ORDER — ELETRIPTAN HYDROBROMIDE 20 MG PO TABS: 20.0000 mg | ORAL_TABLET | ORAL | 0 refills | Status: DC | PRN

## 2022-04-16 MED ORDER — VENLAFAXINE HCL ER 37.5 MG PO CP24: 37.5000 mg | ORAL_CAPSULE | Freq: Every day | ORAL | 1 refills | Status: DC

## 2022-04-16 NOTE — Patient Instructions (Signed)
Thank you for trusting me with your care. To recap, today we discussed the following:   Refills sent to pharmacy. I am glad the new medication is helping with your migraines.   - venlafaxine XR (EFFEXOR XR) 37.5 MG 24 hr capsule; Take 1 capsule (37.5 mg total) by mouth daily with breakfast.  Dispense: 90 capsule; Refill: 1 - eletriptan (RELPAX) 20 MG tablet; Take 1 tablet (20 mg total) by mouth as needed for migraine or headache. May repeat in 2 hours if headache persists or recurs.  Dispense: 10 tablet; Refill: 0

## 2022-04-16 NOTE — Assessment & Plan Note (Signed)
Patient here for follow up of migraine headaches. She was started on Effexor at last visit. She has had one migraines since starting Effexor and this was [place]. She is having delayed orgasm. She would like to continue medication and will let us know if the delayed orgasm becomes a concern.   Assessment/Plan: Migraines, controlled on Effexor for prophylaxis. Continue Relpax for abortive therapy. Refilled medications as below. Can follow up with PCP for continued monitoring at routine visit.  - venlafaxine XR (EFFEXOR XR) 37.5 MG 24 hr capsule; Take 1 capsule (37.5 mg total) by mouth daily with breakfast.  Dispense: 90 capsule; Refill: 1 - eletriptan (RELPAX) 20 MG tablet; Take 1 tablet (20 mg total) by mouth as needed for migraine or headache. May repeat in 2 hours if headache persists or recurs.  Dispense: 10 tablet; Refill: 0

## 2022-04-16 NOTE — Progress Notes (Signed)
     CC: Follow-up (Medication review)    HPI:[name] is a 66 y.o. female who presents for evaluation of migraine headaches. For the details of today's visit, please refer to the assessment and plan.   Past Medical History:  Diagnosis Date   Heart murmur    Phreesia 06/19/2020   Hyperlipidemia    Phreesia 06/19/2020   Hypertension    Phreesia 06/19/2020   Migraine    no aura     Physical Exam: Vitals:   04/16/22 0804  BP: 130/80  Pulse: 80  SpO2: 97%  Weight: 183 lb (83 kg)  Height: 5\' 2"  (1.575 m)     Physical Exam Constitutional:      General: She is not in acute distress.    Appearance: She is not ill-appearing.  Cardiovascular:     Heart sounds: Normal heart sounds. No murmur heard.    No friction rub. No gallop.  Pulmonary:     Effort: Pulmonary effort is normal.     Breath sounds: Normal breath sounds.  Psychiatric:        Mood and Affect: Mood normal.        Behavior: Behavior normal.      Assessment & Plan:   Migraine Patient here for follow up of migraine headaches. She was started on Effexor at last visit. She has had one migraines since starting Effexor and this was [place]. She is having delayed orgasm. She would like to continue medication and will let know if the delayed orgasm becomes a concern.   Assessment/Plan: Migraines, controlled on Effexor for prophylaxis. Continue Relpax for abortive therapy. Refilled medications as below. Can follow up with PCP for continued monitoring at routine visit.  - venlafaxine XR (EFFEXOR XR) 37.5 MG 24 hr capsule; Take 1 capsule (37.5 mg total) by mouth daily with breakfast.  Dispense: 90 capsule; Refill: 1 - eletriptan (RELPAX) 20 MG tablet; Take 1 tablet (20 mg total) by mouth as needed for migraine or headache. May repeat in 2 hours if headache persists or recurs.  Dispense: 10 tablet; Refill: 0     [place], MD

## 2022-04-25 ENCOUNTER — Other Ambulatory Visit: Payer: Self-pay | Admitting: Nurse Practitioner

## 2022-04-25 DIAGNOSIS — I10 Essential (primary) hypertension: Secondary | ICD-10-CM

## 2022-04-25 DIAGNOSIS — E785 Hyperlipidemia, unspecified: Secondary | ICD-10-CM

## 2022-06-05 ENCOUNTER — Other Ambulatory Visit: Payer: Self-pay | Admitting: Internal Medicine

## 2022-06-05 DIAGNOSIS — G43809 Other migraine, not intractable, without status migrainosus: Secondary | ICD-10-CM

## 2022-06-06 MED ORDER — ELETRIPTAN HYDROBROMIDE 20 MG PO TABS: 20.0000 mg | ORAL_TABLET | ORAL | 0 refills | Status: DC | PRN

## 2022-07-24 ENCOUNTER — Other Ambulatory Visit: Payer: Self-pay | Admitting: Family Medicine

## 2022-07-24 DIAGNOSIS — I10 Essential (primary) hypertension: Secondary | ICD-10-CM

## 2022-07-24 DIAGNOSIS — E785 Hyperlipidemia, unspecified: Secondary | ICD-10-CM

## 2022-08-27 ENCOUNTER — Ambulatory Visit (INDEPENDENT_AMBULATORY_CARE_PROVIDER_SITE_OTHER): Payer: Medicare Other | Admitting: Family Medicine

## 2022-08-27 ENCOUNTER — Encounter: Payer: Self-pay | Admitting: Family Medicine

## 2022-08-27 VITALS — BP 138/74 | HR 98 | Ht 62.0 in | Wt 181.1 lb

## 2022-08-27 DIAGNOSIS — E782 Mixed hyperlipidemia: Secondary | ICD-10-CM | POA: Diagnosis not present

## 2022-08-27 DIAGNOSIS — E559 Vitamin D deficiency, unspecified: Secondary | ICD-10-CM

## 2022-08-27 DIAGNOSIS — R7301 Impaired fasting glucose: Secondary | ICD-10-CM

## 2022-08-27 DIAGNOSIS — Z1231 Encounter for screening mammogram for malignant neoplasm of breast: Secondary | ICD-10-CM | POA: Diagnosis not present

## 2022-08-27 DIAGNOSIS — I10 Essential (primary) hypertension: Secondary | ICD-10-CM

## 2022-08-27 DIAGNOSIS — E0789 Other specified disorders of thyroid: Secondary | ICD-10-CM

## 2022-08-27 DIAGNOSIS — G43809 Other migraine, not intractable, without status migrainosus: Secondary | ICD-10-CM

## 2022-08-27 DIAGNOSIS — R7303 Prediabetes: Secondary | ICD-10-CM

## 2022-08-27 MED ORDER — ELETRIPTAN HYDROBROMIDE 20 MG PO TABS: 20.0000 mg | ORAL_TABLET | ORAL | 0 refills | Status: DC | PRN

## 2022-08-27 NOTE — Assessment & Plan Note (Signed)
She reports maternal family history of breast cancer, noting that her mom and sister both had breast cancer Mammogram was completed last year, 2023 with recommendation for annual screenings Mammogram ordered

## 2022-08-27 NOTE — Assessment & Plan Note (Signed)
Controlled She takes enalapril 10 mg daily Denies headaches, dizziness, blurred vision, chest pain, palpitation Encouraged to continue treatment regimen We will follow-up in 4 months BP Readings from Last 3 Encounters:  08/27/22 138/74  04/16/22 130/80  03/26/22 132/78

## 2022-08-27 NOTE — Assessment & Plan Note (Signed)
Reports having migraine headaches twice a month Less frequently than she normally does Duration of her headache is an hour She takes eletriptan 20 mg as needed for abortive treatment and venlafaxine 37.5 mg daily for preventative treatment No complaints or concerns voiced Encouraged to continue treatment regimen

## 2022-08-27 NOTE — Assessment & Plan Note (Signed)
She takes simvastatin 20 mg daily Denies muscle aches, abdominal pain, nausea vomiting She is compliant with treatment regiment Pending lipid panel Lab Results  Component Value Date   CHOL 182 02/20/2022   HDL 47 02/20/2022   LDLCALC 107 (H) 02/20/2022   TRIG 157 (H) 02/20/2022   CHOLHDL 3.9 02/20/2022

## 2022-08-27 NOTE — Progress Notes (Signed)
Established Patient Office Visit  Subjective:  Patient ID: Erin Griffin, female    DOB: 03/12/1956  Age: 67 y.o. MRN: [id number]  CC:  Chief Complaint  Patient presents with   Follow-up    6 month f/u     HPI Erin Griffin is a 67 y.o. female with past medical history of hyperlipidemia, hypertension, migraine headache presents for f/u of  chronic medical conditions. For the details of today's visit, please refer to the assessment and plan.     Past Medical History:  Diagnosis Date   Heart murmur    Phreesia 06/19/2020   Hyperlipidemia    Phreesia 06/19/2020   Hypertension    Phreesia 06/19/2020   Migraine    no aura    Past Surgical History:  Procedure Laterality Date   APPENDECTOMY N/A    Phreesia 06/19/2020   OVARIAN CYST REMOVAL Right     Family History  Problem Relation Age of Onset   Breast cancer Mother    Breast cancer Sister     Social History   Socioeconomic History   Marital status: Married    Spouse name: Not on file   Number of children: Not on file   Years of education: Not on file   Highest education level: Some college, no degree  Occupational History   Occupation: Retired  Tobacco Use   Smoking status: Never   Smokeless tobacco: Never  Vaping Use   Vaping Use: Never used  Substance and Sexual Activity   Alcohol use: Yes    Alcohol/week: 2.0 standard drinks of alcohol    Types: 2 Glasses of wine per week    Comment: per week    Drug use: Never   Sexual activity: Yes  Other Topics Concern   Not on file  Social History Narrative   Not on file   Social Determinants of Health   Financial Resource Strain: Low Risk  (08/24/2022)   Overall Financial Resource Strain (CARDIA)    Difficulty of Paying Living Expenses: Not hard at all  Food Insecurity: No Food Insecurity (08/24/2022)   Hunger Vital Sign    Worried About Running Out of Food in the Last Year: Never true    Ran Out of Food in the Last Year: Never true  Transportation Needs:  No Transportation Needs (08/24/2022)   PRAPARE - Hydrologist (Medical): No    Lack of Transportation (Non-Medical): No  Physical Activity: Insufficiently Active (08/24/2022)   Exercise Vital Sign    Days of Exercise per Week: 2 days    Minutes of Exercise per Session: 60 min  Stress: No Stress Concern Present (08/24/2022)   [place]    Feeling of Stress : Not at all  Social Connections: [place] (08/24/2022)   Social Connection and Isolation Panel [NHANES]    Frequency of Communication with Friends and Family: More than three times a week    Frequency of Social Gatherings with Friends and Family: Once a week    Attends Religious Services: More than 4 times per year    Active Member of [employer] or Organizations: Yes    Attends Music therapist: More than 4 times per year    Marital Status: Married  Human resources officer Violence: Not on file    Outpatient Medications Prior to Visit  Medication Sig Dispense Refill   enalapril (VASOTEC) 10 MG tablet TAKE 1 TABLET(10 MG) BY MOUTH DAILY 30 tablet  3   simvastatin (ZOCOR) 20 MG tablet TAKE 1 TABLET(20 MG) BY MOUTH DAILY 30 tablet 3   venlafaxine XR (EFFEXOR XR) 37.5 MG 24 hr capsule Take 1 capsule (37.5 mg total) by mouth daily with breakfast. 90 capsule 1   eletriptan (RELPAX) 20 MG tablet Take 1 tablet (20 mg total) by mouth as needed for migraine or headache. May repeat in 2 hours if headache persists or recurs. 10 tablet 0   No facility-administered medications prior to visit.    Allergies  Allergen Reactions   Shellfish Allergy Hives, Itching and Swelling    ROS Review of Systems  Constitutional:  Negative for chills and fever.  Eyes:  Negative for visual disturbance.  Respiratory:  Negative for chest tightness and shortness of breath.   Neurological:  Negative for dizziness and headaches.      Objective:     Physical Exam HENT:     Head: Normocephalic.     Mouth/Throat:     Mouth: Mucous membranes are moist.  Cardiovascular:     Rate and Rhythm: Normal rate.     Heart sounds: Normal heart sounds.  Pulmonary:     Effort: Pulmonary effort is normal.     Breath sounds: Normal breath sounds.  Neurological:     Mental Status: She is alert.     BP 138/74 (BP Location: Left Arm)   Pulse 98   Ht 5\' 2"  (1.575 m)   Wt 181 lb 1.9 oz (82.2 kg)   SpO2 98%   BMI 33.13 kg/m  Wt Readings from Last 3 Encounters:  08/27/22 181 lb 1.9 oz (82.2 kg)  04/16/22 183 lb (83 kg)  03/26/22 183 lb 1.9 oz (83.1 kg)    Lab Results  Component Value Date   TSH 1.760 02/20/2022   Lab Results  Component Value Date   WBC 5.9 02/20/2022   HGB 13.8 02/20/2022   HCT 40.4 02/20/2022   MCV 94 02/20/2022   PLT 238 02/20/2022   Lab Results  Component Value Date   NA 141 02/20/2022   K 4.9 02/20/2022   CO2 24 02/20/2022   GLUCOSE 115 (H) 02/20/2022   BUN 21 02/20/2022   CREATININE 0.83 02/20/2022   BILITOT 0.6 02/20/2022   ALKPHOS 104 02/20/2022   AST 14 02/20/2022   ALT 15 02/20/2022   PROT 6.5 02/20/2022   ALBUMIN 4.4 02/20/2022   CALCIUM 9.4 02/20/2022   EGFR 78 02/20/2022   Lab Results  Component Value Date   CHOL 182 02/20/2022   Lab Results  Component Value Date   HDL 47 02/20/2022   Lab Results  Component Value Date   LDLCALC 107 (H) 02/20/2022   Lab Results  Component Value Date   TRIG 157 (H) 02/20/2022   Lab Results  Component Value Date   CHOLHDL 3.9 02/20/2022   Lab Results  Component Value Date   HGBA1C 6.0 (H) 02/20/2022      Assessment & Plan:  Hypertension, essential Assessment & Plan: Controlled She takes enalapril 10 mg daily Denies headaches, dizziness, blurred vision, chest pain, palpitation Encouraged to continue treatment regimen We will follow-up in 4 months BP Readings from Last 3 Encounters:  08/27/22 138/74  04/16/22 130/80  03/26/22 132/78       Orders: -     CMP14+EGFR -     CBC with Differential/Platelet  Other migraine without status migrainosus, not intractable Assessment & Plan: Reports having migraine headaches twice a month Less frequently than she normally  does Duration of her headache is an hour She takes eletriptan 20 mg as needed for abortive treatment and venlafaxine 37.5 mg daily for preventative treatment No complaints or concerns voiced Encouraged to continue treatment regimen  Orders: -     Eletriptan Hydrobromide; Take 1 tablet (20 mg total) by mouth as needed for migraine or headache. May repeat in 2 hours if headache persists or recurs.  Dispense: 15 tablet; Refill: 0  Encounter for screening mammogram for malignant neoplasm of breast Assessment & Plan: She reports maternal family history of breast cancer, noting that her mom and sister both had breast cancer Mammogram was completed last year, 2023 with recommendation for annual screenings Mammogram ordered    Mixed hyperlipidemia Assessment & Plan: She takes simvastatin 20 mg daily Denies muscle aches, abdominal pain, nausea vomiting She is compliant with treatment regiment Pending lipid panel Lab Results  Component Value Date   CHOL 182 02/20/2022   HDL 47 02/20/2022   LDLCALC 107 (H) 02/20/2022   TRIG 157 (H) 02/20/2022   CHOLHDL 3.9 02/20/2022     Orders: -     Lipid panel  Breast cancer screening by mammogram -     3D Screening Mammogram, Left and Right  Prediabetes -     TSH + free T4  Other specified disorders of thyroid  Impaired fasting blood sugar -     Hemoglobin A1c  Vitamin D deficiency -     VITAMIN D 25 Hydroxy (Vit-D Deficiency, Fractures)    Follow-up: Return in about 4 months (around 12/27/2022).   Alvira Monday, FNP

## 2022-08-27 NOTE — Patient Instructions (Addendum)
I appreciate the opportunity to provide care to you today!    Follow up:  4 months  Labs: please stop by the lab during the week to get your blood drawn (CBC, CMP, TSH, Lipid profile, HgA1c, Vit D)   Please pick up your refills at the pharmacy   Please continue to a heart-healthy diet and increase your physical activities. Try to exercise for 73mins at least five days a week.   Physical activity helps: Lower your blood glucose, improve your heart health, lower your blood pressure and cholesterol, burn calories to help manage her weight, gave you energy, lower stress, and improve his sleep.  The American diabetes Association (ADA) recommends being active for 2-1/2 hours (150 minutes) or more week.  Exercise for 30 minutes, 5 days a week (150 minutes total)     It was a pleasure to see you and I look forward to continuing to work together on your health and well-being. Please do not hesitate to call the office if you need care or have questions about your care.   Have a wonderful day and week. With Gratitude, Alvira Monday MSN, FNP-BC

## 2022-08-29 ENCOUNTER — Other Ambulatory Visit: Payer: Self-pay | Admitting: Family Medicine

## 2022-08-29 DIAGNOSIS — G43809 Other migraine, not intractable, without status migrainosus: Secondary | ICD-10-CM

## 2022-09-17 ENCOUNTER — Ambulatory Visit (HOSPITAL_COMMUNITY)
Admission: RE | Admit: 2022-09-17 | Discharge: 2022-09-17 | Disposition: A | Discharge status: Home / Self Care | Payer: Medicare Other | Source: Ambulatory Visit | Attending: Family Medicine | Admitting: Family Medicine

## 2022-09-17 ENCOUNTER — Encounter (HOSPITAL_COMMUNITY): Payer: Self-pay

## 2022-09-17 DIAGNOSIS — Z1231 Encounter for screening mammogram for malignant neoplasm of breast: Secondary | ICD-10-CM | POA: Insufficient documentation

## 2022-09-19 LAB — CMP14+EGFR
ALT: 18 IU/L (ref 0–32)
AST: 18 IU/L (ref 0–40)
Albumin/Globulin Ratio: 2.1 (ref 1.2–2.2)
Albumin: 4.2 g/dL (ref 3.9–4.9)
Alkaline Phosphatase: 110 IU/L (ref 44–121)
BUN/Creatinine Ratio: 27 (ref 12–28)
BUN: 20 mg/dL (ref 8–27)
Bilirubin Total: 0.7 mg/dL (ref 0.0–1.2)
CO2: 21 mmol/L (ref 20–29)
Calcium: 9.2 mg/dL (ref 8.7–10.3)
Chloride: 104 mmol/L (ref 96–106)
Creatinine, Ser: 0.73 mg/dL (ref 0.57–1.00)
Globulin, Total: 2 g/dL (ref 1.5–4.5)
Glucose: 112 mg/dL — ABNORMAL HIGH (ref 70–99)
Potassium: 4.6 mmol/L (ref 3.5–5.2)
Sodium: 143 mmol/L (ref 134–144)
Total Protein: 6.2 g/dL (ref 6.0–8.5)
eGFR: 91 mL/min/{1.73_m2} (ref >59)

## 2022-09-19 LAB — LIPID PANEL
Chol/HDL Ratio: 3.5 ratio (ref 0.0–4.4)
Cholesterol, Total: 191 mg/dL (ref 100–199)
HDL: 55 mg/dL (ref >39)
LDL Chol Calc (NIH): 113 mg/dL — ABNORMAL HIGH (ref 0–99)
Triglycerides: 132 mg/dL (ref 0–149)
VLDL Cholesterol Cal: 23 mg/dL (ref 5–40)

## 2022-09-19 LAB — CBC WITH DIFFERENTIAL/PLATELET
Basophils Absolute: 0.1 10*3/uL (ref 0.0–0.2)
Basos: 1 %
EOS (ABSOLUTE): 0.1 10*3/uL (ref 0.0–0.4)
Eos: 2 %
Hematocrit: 41.9 % (ref 34.0–46.6)
Hemoglobin: 13.8 g/dL (ref 11.1–15.9)
Immature Grans (Abs): 0 10*3/uL (ref 0.0–0.1)
Immature Granulocytes: 0 %
Lymphocytes Absolute: 2 10*3/uL (ref 0.7–3.1)
Lymphs: 35 %
MCH: 30.5 pg (ref 26.6–33.0)
MCHC: 32.9 g/dL (ref 31.5–35.7)
MCV: 93 fL (ref 79–97)
Monocytes Absolute: 0.5 10*3/uL (ref 0.1–0.9)
Monocytes: 8 %
Neutrophils Absolute: 3.1 10*3/uL (ref 1.4–7.0)
Neutrophils: 54 %
Platelets: 221 10*3/uL (ref 150–450)
RBC: 4.52 x10E6/uL (ref 3.77–5.28)
RDW: 12.5 % (ref 11.7–15.4)
WBC: 5.7 10*3/uL (ref 3.4–10.8)

## 2022-09-19 LAB — TSH+FREE T4
Free T4: 1.18 ng/dL (ref 0.82–1.77)
TSH: 1.63 u[IU]/mL (ref 0.450–4.500)

## 2022-09-19 LAB — HEMOGLOBIN A1C
Est. average glucose Bld gHb Est-mCnc: 123 mg/dL
Hgb A1c MFr Bld: 5.9 % — ABNORMAL HIGH (ref 4.8–5.6)

## 2022-09-19 LAB — VITAMIN D 25 HYDROXY (VIT D DEFICIENCY, FRACTURES): Vit D, 25-Hydroxy: 31.4 ng/mL (ref 30.0–100.0)

## 2022-09-26 ENCOUNTER — Other Ambulatory Visit: Payer: Self-pay | Admitting: Family Medicine

## 2022-09-26 DIAGNOSIS — E782 Mixed hyperlipidemia: Secondary | ICD-10-CM

## 2022-09-26 MED ORDER — SIMVASTATIN 40 MG PO TABS: 40.0000 mg | ORAL_TABLET | Freq: Every day | ORAL | 3 refills | Status: DC

## 2022-10-19 ENCOUNTER — Other Ambulatory Visit: Payer: Self-pay | Admitting: Family Medicine

## 2022-10-19 ENCOUNTER — Other Ambulatory Visit: Payer: Self-pay

## 2022-10-19 DIAGNOSIS — I10 Essential (primary) hypertension: Secondary | ICD-10-CM

## 2022-10-19 DIAGNOSIS — E785 Hyperlipidemia, unspecified: Secondary | ICD-10-CM

## 2022-10-19 MED ORDER — ENALAPRIL MALEATE 10 MG PO TABS: ORAL_TABLET | ORAL | 3 refills | Status: DC

## 2022-10-30 ENCOUNTER — Other Ambulatory Visit: Payer: Self-pay | Admitting: Family Medicine

## 2022-10-30 DIAGNOSIS — G43809 Other migraine, not intractable, without status migrainosus: Secondary | ICD-10-CM

## 2022-10-30 MED ORDER — ELETRIPTAN HYDROBROMIDE 20 MG PO TABS: 20.0000 mg | ORAL_TABLET | ORAL | 0 refills | Status: DC | PRN

## 2022-12-26 ENCOUNTER — Other Ambulatory Visit: Payer: Self-pay | Admitting: Family Medicine

## 2022-12-26 DIAGNOSIS — G43809 Other migraine, not intractable, without status migrainosus: Secondary | ICD-10-CM

## 2022-12-27 MED ORDER — ELETRIPTAN HYDROBROMIDE 20 MG PO TABS
20.0000 mg | ORAL_TABLET | ORAL | 0 refills | Status: DC | PRN
Start: 2022-12-27 — End: 2023-03-21

## 2022-12-31 ENCOUNTER — Ambulatory Visit (INDEPENDENT_AMBULATORY_CARE_PROVIDER_SITE_OTHER): Payer: Medicare Other | Admitting: Family Medicine

## 2022-12-31 ENCOUNTER — Encounter: Payer: Self-pay | Admitting: Family Medicine

## 2022-12-31 VITALS — BP 138/88 | HR 77 | Ht 62.0 in | Wt 178.1 lb

## 2022-12-31 DIAGNOSIS — I10 Essential (primary) hypertension: Secondary | ICD-10-CM | POA: Diagnosis not present

## 2022-12-31 DIAGNOSIS — R7301 Impaired fasting glucose: Secondary | ICD-10-CM

## 2022-12-31 DIAGNOSIS — G43009 Migraine without aura, not intractable, without status migrainosus: Secondary | ICD-10-CM | POA: Diagnosis not present

## 2022-12-31 DIAGNOSIS — E7849 Other hyperlipidemia: Secondary | ICD-10-CM

## 2022-12-31 DIAGNOSIS — E038 Other specified hypothyroidism: Secondary | ICD-10-CM

## 2022-12-31 DIAGNOSIS — E559 Vitamin D deficiency, unspecified: Secondary | ICD-10-CM

## 2022-12-31 NOTE — Progress Notes (Signed)
Established Patient Office Visit  Subjective:  Patient ID: Erin Griffin, female    DOB: 01/22/1956  Age: 67 y.o. MRN: [id number]  CC:  Chief Complaint  Patient presents with   Care Management    4 month f/u.     HPI Erin Griffin is a 67 y.o. female with past medical history of HLP, HTN and migraines presents for f/u of  chronic medical conditions. For the details of today's visit, please refer to the assessment and plan.     Past Medical History:  Diagnosis Date   Heart murmur    Phreesia 06/19/2020   Hyperlipidemia    Phreesia 06/19/2020   Hypertension    Phreesia 06/19/2020   Migraine    no aura    Past Surgical History:  Procedure Laterality Date   APPENDECTOMY N/A    Phreesia 06/19/2020   OVARIAN CYST REMOVAL Right     Family History  Problem Relation Age of Onset   Breast cancer Mother    Breast cancer Sister     Social History   Socioeconomic History   Marital status: Married    Spouse name: Not on file   Number of children: Not on file   Years of education: Not on file   Highest education level: Some college, no degree  Occupational History   Occupation: Retired  Tobacco Use   Smoking status: Never   Smokeless tobacco: Never  Vaping Use   Vaping status: Never Used  Substance and Sexual Activity   Alcohol use: Yes    Alcohol/week: 2.0 standard drinks of alcohol    Types: 2 Glasses of wine per week    Comment: per week    Drug use: Never   Sexual activity: Yes  Other Topics Concern   Not on file  Social History Narrative   Not on file   Social Determinants of Health   Financial Resource Strain: Low Risk  (08/24/2022)   Overall Financial Resource Strain (CARDIA)    Difficulty of Paying Living Expenses: Not hard at all  Food Insecurity: No Food Insecurity (08/24/2022)   Hunger Vital Sign    Worried About Running Out of Food in the Last Year: Never true    Ran Out of Food in the Last Year: Never true  Transportation Needs: No  Transportation Needs (08/24/2022)   PRAPARE - Administrator, Civil Service (Medical): No    Lack of Transportation (Non-Medical): No  Physical Activity: Insufficiently Active (08/24/2022)   Exercise Vital Sign    Days of Exercise per Week: 2 days    Minutes of Exercise per Session: 60 min  Stress: No Stress Concern Present (08/24/2022)   [employer] of Occupational Health - Occupational Stress Questionnaire    Feeling of Stress : Not at all  Social Connections: Socially Integrated (08/24/2022)   Social Connection and Isolation Panel [NHANES]    Frequency of Communication with Friends and Family: More than three times a week    Frequency of Social Gatherings with Friends and Family: Once a week    Attends Religious Services: More than 4 times per year    Active Member of [employer] or Organizations: Yes    Attends Engineer, structural: More than 4 times per year    Marital Status: Married  Catering manager Violence: Not on file    Outpatient Medications Prior to Visit  Medication Sig Dispense Refill   eletriptan (RELPAX) 20 MG tablet Take 1 tablet (20 mg total) by mouth  as needed for migraine or headache. May repeat in 2 hours if headache persists or recurs. 15 tablet 0   enalapril (VASOTEC) 10 MG tablet TAKE 1 TABLET(10 MG) BY MOUTH DAILY 30 tablet 3   simvastatin (ZOCOR) 40 MG tablet Take 1 tablet (40 mg total) by mouth at bedtime. 90 tablet 3   venlafaxine XR (EFFEXOR XR) 37.5 MG 24 hr capsule Take 1 capsule (37.5 mg total) by mouth daily with breakfast. 90 capsule 1   No facility-administered medications prior to visit.    Allergies  Allergen Reactions   Shellfish Allergy Hives, Itching and Swelling    ROS Review of Systems  Constitutional:  Negative for chills and fever.  Eyes:  Negative for visual disturbance.  Respiratory:  Negative for chest tightness and shortness of breath.   Neurological:  Negative for dizziness and headaches.      Objective:     Physical Exam HENT:     Head: Normocephalic.     Mouth/Throat:     Mouth: Mucous membranes are moist.  Cardiovascular:     Rate and Rhythm: Normal rate.     Heart sounds: Normal heart sounds.  Pulmonary:     Effort: Pulmonary effort is normal.     Breath sounds: Normal breath sounds.  Neurological:     Mental Status: She is alert.     BP 138/88 (BP Location: Left Arm)   Pulse 77   Ht 5\' 2"  (1.575 m)   Wt 178 lb 1.3 oz (80.8 kg)   SpO2 96%   BMI 32.57 kg/m  Wt Readings from Last 3 Encounters:  12/31/22 178 lb 1.3 oz (80.8 kg)  08/27/22 181 lb 1.9 oz (82.2 kg)  04/16/22 183 lb (83 kg)    Lab Results  Component Value Date   TSH 1.630 09/18/2022   Lab Results  Component Value Date   WBC 5.7 09/18/2022   HGB 13.8 09/18/2022   HCT 41.9 09/18/2022   MCV 93 09/18/2022   PLT 221 09/18/2022   Lab Results  Component Value Date   NA 143 09/18/2022   K 4.6 09/18/2022   CO2 21 09/18/2022   GLUCOSE 112 (H) 09/18/2022   BUN 20 09/18/2022   CREATININE 0.73 09/18/2022   BILITOT 0.7 09/18/2022   ALKPHOS 110 09/18/2022   AST 18 09/18/2022   ALT 18 09/18/2022   PROT 6.2 09/18/2022   ALBUMIN 4.2 09/18/2022   CALCIUM 9.2 09/18/2022   EGFR 91 09/18/2022   Lab Results  Component Value Date   CHOL 191 09/18/2022   Lab Results  Component Value Date   HDL 55 09/18/2022   Lab Results  Component Value Date   LDLCALC 113 (H) 09/18/2022   Lab Results  Component Value Date   TRIG 132 09/18/2022   Lab Results  Component Value Date   CHOLHDL 3.5 09/18/2022   Lab Results  Component Value Date   HGBA1C 5.9 (H) 09/18/2022      Assessment & Plan:  Hypertension, essential Assessment & Plan: Controlled Uncontrolled She takes enalapril 10 mg daily Denies headaches, dizziness, blurred vision, chest pain, palpitation Encouraged to continue treatment regimen We will follow-up in 4 months BP Readings from Last 3 Encounters:  12/31/22 138/88  08/27/22 138/74   04/16/22 130/80       Migraine without aura and without status migrainosus, not intractable Assessment & Plan: She takes eletriptan 20 mg as needed for abortive treatment and venlafaxine 37.5 mg daily for preventative treatment No complaints or  concerns voiced Encouraged to continue treatment regimen   Other hyperlipidemia Assessment & Plan: She takes simvastatin 20 mg daily Denies muscle aches, abdominal pain, nausea vomiting Encouraged a heart healthy diet with increased physical activity She is compliant with treatment regiment Pending lipid panel Lab Results  Component Value Date   CHOL 191 09/18/2022   HDL 55 09/18/2022   LDLCALC 113 (H) 09/18/2022   TRIG 132 09/18/2022   CHOLHDL 3.5 09/18/2022     Orders: -     Lipid panel -     CMP14+EGFR -     CBC with Differential/Platelet  IFG (impaired fasting glucose) -     Hemoglobin A1c  Vitamin D deficiency -     VITAMIN D 25 Hydroxy (Vit-D Deficiency, Fractures)  Other specified hypothyroidism -     TSH + free T4  Note: This chart has been completed using Engineer, civil (consulting) software, and while attempts have been made to ensure accuracy, certain words and phrases may not be transcribed as intended.    Follow-up: Return in about 4 months (around 05/03/2023).   Gilmore Laroche, FNP

## 2022-12-31 NOTE — Patient Instructions (Signed)
I appreciate the opportunity to provide care to you today!    Follow up:  4 months  Labs: please stop by the lab during the week to get your blood drawn (CBC, CMP, TSH, Lipid profile, HgA1c, Vit D)  Attached with your AVS, you will find valuable resources for self-education. I highly recommend dedicating some time to thoroughly examine them.   Please continue to a heart-healthy diet and increase your physical activities. Try to exercise for at least five days a week.    It was a pleasure to see you and I look forward to continuing to work together on your health and well-being. Please do not hesitate to call the office if you need care or have questions about your care.  In case of emergency, please visit the Emergency Department for urgent care, or contact our clinic at [phone] to schedule an appointment. We're here to help you!   Have a wonderful day and week. With Gratitude, Gilmore Laroche MSN, FNP-BC

## 2023-01-04 NOTE — Assessment & Plan Note (Signed)
She takes eletriptan 20 mg as needed for abortive treatment and venlafaxine 37.5 mg daily for preventative treatment No complaints or concerns voiced Encouraged to continue treatment regimen

## 2023-01-04 NOTE — Assessment & Plan Note (Addendum)
She takes simvastatin 20 mg daily Denies muscle aches, abdominal pain, nausea vomiting Encouraged a heart healthy diet with increased physical activity She is compliant with treatment regiment Pending lipid panel Lab Results  Component Value Date   CHOL 191 09/18/2022   HDL 55 09/18/2022   LDLCALC 113 (H) 09/18/2022   TRIG 132 09/18/2022   CHOLHDL 3.5 09/18/2022

## 2023-01-04 NOTE — Assessment & Plan Note (Signed)
Controlled Uncontrolled She takes enalapril 10 mg daily Denies headaches, dizziness, blurred vision, chest pain, palpitation Encouraged to continue treatment regimen We will follow-up in 4 months BP Readings from Last 3 Encounters:  12/31/22 138/88  08/27/22 138/74  04/16/22 130/80

## 2023-01-17 ENCOUNTER — Other Ambulatory Visit: Payer: Self-pay | Admitting: Family Medicine

## 2023-01-17 DIAGNOSIS — I10 Essential (primary) hypertension: Secondary | ICD-10-CM

## 2023-02-05 ENCOUNTER — Telehealth: Payer: Self-pay | Admitting: Family Medicine

## 2023-02-05 NOTE — Telephone Encounter (Signed)
Prescription Request  02/05/2023  LOV: 12/31/2022  What is the name of the medication or equipment? enalapril (VASOTEC) 10 MG tablet [[id number]]   Have you contacted your pharmacy to request a refill? Yes   Which pharmacy would you like this sent to?  [facility] [phone] - [place], [place] - [address] AT [facility] OF [address] [place] [address] Phone: [phone] Fax: [phone]    Patient notified that their request is being sent to the clinical staff for review and that they should receive a response within 2 business days.   Please advise at Mobile [phone] (mobile)

## 2023-02-06 NOTE — Telephone Encounter (Signed)
Refill sent on 01/17/23 with 3 additional refills.

## 2023-02-07 ENCOUNTER — Telehealth: Payer: Medicare Other | Admitting: Physician Assistant

## 2023-02-07 ENCOUNTER — Telehealth: Payer: Self-pay | Admitting: Family Medicine

## 2023-02-07 ENCOUNTER — Other Ambulatory Visit: Payer: Self-pay

## 2023-02-07 DIAGNOSIS — I10 Essential (primary) hypertension: Secondary | ICD-10-CM

## 2023-02-07 DIAGNOSIS — M7918 Myalgia, other site: Secondary | ICD-10-CM

## 2023-02-07 DIAGNOSIS — M5430 Sciatica, unspecified side: Secondary | ICD-10-CM | POA: Diagnosis not present

## 2023-02-07 MED ORDER — PREDNISONE 10 MG (21) PO TBPK
ORAL_TABLET | ORAL | 0 refills | Status: DC
Start: 2023-02-07 — End: 2023-09-23

## 2023-02-07 MED ORDER — ENALAPRIL MALEATE 10 MG PO TABS
ORAL_TABLET | ORAL | 3 refills | Status: DC
Start: 2023-02-07 — End: 2023-05-31

## 2023-02-07 NOTE — Telephone Encounter (Signed)
Prescription Request  02/07/2023  LOV: 12/31/2022  What is the name of the medication or equipment? enalapril (VASOTEC) 10 MG tablet [[id number]]    Have you contacted your pharmacy to request a refill? Yes   Which pharmacy would you like this sent to?  [facility] [phone] - [place], [place] - [address] AT [facility] [address] [place] [address] Phone: [phone] Fax: [phone]    Patient notified that their request is being sent to the clinical staff for review and that they should receive a response within 2 business days.   Please advise at Mobile [phone] (mobile)

## 2023-02-07 NOTE — Telephone Encounter (Signed)
Refill sent.

## 2023-02-07 NOTE — Progress Notes (Signed)
I have spent 5 minutes in review of e-visit questionnaire, review and updating patient chart, medical decision making and response to patient.   William Cody Martin, PA-C    

## 2023-02-07 NOTE — Progress Notes (Signed)
We are sorry that you are not feeling well.  Here is how we plan to help!  Based on what you have shared with me it looks like you mostly have acute back pain.  Acute back pain is defined as musculoskeletal pain that can resolve in 1-3 weeks with conservative treatment.  I have prescribed a steroid pack to take as directed to help reduce inflammation and the irritated nerves running through the gluteal region, down the back of your leg. Back pain is very common.  The pain often gets better over time.  The cause of back pain is usually not dangerous.  Most people can learn to manage their back pain on their own.  Home Care Stay active.  Start with short walks on flat ground if you can.  Try to walk farther each day. Do not sit, drive or stand in one place for more than 30 minutes.  Do not stay in bed. Do not avoid exercise or work.  Activity can help your back heal faster. Be careful when you bend or lift an object.  Bend at your knees, keep the object close to you, and do not twist. Sleep on a firm mattress.  Lie on your side, and bend your knees.  If you lie on your back, put a pillow under your knees. Only take medicines as told by your doctor. Put ice on the injured area. Put ice in a plastic bag Place a towel between your skin and the bag Leave the ice on for 15-20 minutes, 3-4 times a day for the first 2-3 days. 210 After that, you can switch between ice and heat packs. Ask your doctor about back exercises or massage. Avoid feeling anxious or stressed.  Find good ways to deal with stress, such as exercise.  Get Help Right Way If: Your pain does not go away with rest or medicine. Your pain does not go away in 1 week. You have new problems. You do not feel well. The pain spreads into your legs. You cannot control when you poop (bowel movement) or pee (urinate) You feel sick to your stomach (nauseous) or throw up (vomit) You have belly (abdominal) pain. You feel like you may pass out  (faint). If you develop a fever.  Make Sure you: Understand these instructions. Will watch your condition Will get help right away if you are not doing well or get worse.  Your e-visit answers were reviewed by a board certified advanced clinical practitioner to complete your personal care plan.  Depending on the condition, your plan could have included both over the counter or prescription medications.  If there is a problem please reply  once you have received a response from your provider.  Your safety is important to [place].  If you have drug allergies check your prescription carefully.    You can use MyChart to ask questions about today's visit, request a non-urgent call back, or ask for a work or school excuse for 24 hours related to this e-Visit. If it has been greater than 24 hours you will need to follow up with your provider, or enter a new e-Visit to address those concerns.  You will get an e-mail in the next two days asking about your experience.  I hope that your e-visit has been valuable and will speed your recovery. Thank you for using e-visits.

## 2023-03-21 ENCOUNTER — Other Ambulatory Visit: Payer: Self-pay | Admitting: Family Medicine

## 2023-03-21 DIAGNOSIS — G43809 Other migraine, not intractable, without status migrainosus: Secondary | ICD-10-CM

## 2023-03-21 MED ORDER — ELETRIPTAN HYDROBROMIDE 20 MG PO TABS
20.0000 mg | ORAL_TABLET | ORAL | 0 refills | Status: DC | PRN
Start: 2023-03-21 — End: 2023-05-04

## 2023-05-04 ENCOUNTER — Other Ambulatory Visit: Payer: Self-pay | Admitting: Family Medicine

## 2023-05-04 DIAGNOSIS — G43809 Other migraine, not intractable, without status migrainosus: Secondary | ICD-10-CM

## 2023-05-06 ENCOUNTER — Ambulatory Visit: Payer: BC Managed Care – PPO | Admitting: Family Medicine

## 2023-05-06 MED ORDER — ELETRIPTAN HYDROBROMIDE 20 MG PO TABS: 20.0000 mg | ORAL_TABLET | ORAL | 0 refills | Status: DC | PRN

## 2023-05-08 ENCOUNTER — Ambulatory Visit (INDEPENDENT_AMBULATORY_CARE_PROVIDER_SITE_OTHER): Payer: Medicare Other

## 2023-05-08 VITALS — Ht 62.0 in | Wt 179.0 lb

## 2023-05-08 DIAGNOSIS — Z0000 Encounter for general adult medical examination without abnormal findings: Secondary | ICD-10-CM

## 2023-05-08 NOTE — Patient Instructions (Signed)
Erin Griffin , Thank you for taking time to come for your Medicare Wellness Visit. I appreciate your ongoing commitment to your health goals. Please review the following plan we discussed and let me know if I can assist you in the future.   Referrals/Orders/Follow-Ups/Clinician Recommendations:  Next Medicare Annual Wellness Visit: May 11, 2024 at 8:00 am virtual visit  You are due for the vaccines checked below. You may have these done at your preferred pharmacy. Please have them fax the office proof of the vaccines so that we can update your chart.   [x]  Flu (due annually)  Recommended this fall either at PCP office or through your local pharmacy. The flu season starts August 1 of each year.   [x]  Shingrix (Shingles vaccine): CDC recommends 2 doses of Shingrix separated by 2-6 months for aged 40 years and older:  [x]  Pneumonia Vaccines: Recommended for adults 65 years or older  []  TDAP (Tetanus) Vaccine every 10 years:Recommended every 10 years; Please call your insurance company to determine your out of pocket expense. You also receive this vaccine at your local pharmacy or Health Dept.  [x]  Covid-19: Available now at any [facility] pharmacy (see info below)  You may also get your vaccines at any [facility] (locations listed below.) Vaccine hours are Monday - Friday 9:00 - 4:00. No appointments are required. Most insurances are accepted including Medicaid. Anyone can use the community pharmacies, and people are not required to have a [facility] provider.  Community Pharmacy Locations offering vaccines:   Sport and exercise psychologist   [facility] [place]  10 vaccines are offered at the [employer]: Covid, flu, Tdap, shingles, RSV, pneumonia, meningococcal, hepatitis A, hepatitis B, and HPV.    This is a list of the screening recommended for you and due dates:  Health  Maintenance  Topic Date Due   Zoster (Shingles) Vaccine (1 of 2) Never done   Pneumonia Vaccine (1 of 1 - PCV) Never done   Flu Shot  Never done   Mammogram  09/17/2023   Medicare Annual Wellness Visit  05/07/2024   Colon Cancer Screening  09/08/2025   DEXA scan (bone density measurement)  03/03/2027   DTaP/Tdap/Td vaccine (2 - Td or Tdap) 12/10/2027   Hepatitis C Screening  Completed   HPV Vaccine  Aged Out   COVID-19 Vaccine  Discontinued    Advanced directives: (ACP Link)Information on Advanced Care Planning can be found at [facility] Advance Health Care Directives Advance Health Care Directives ([email or web address]   Next Medicare Annual Wellness Visit scheduled for next year: Yes  Preventive Care 65 Years and Older, Female Preventive care refers to lifestyle choices and visits with your health care provider that can promote health and wellness. Preventive care visits are also called wellness exams. What can I expect for my preventive care visit? Counseling Your health care provider may ask you questions about your: Medical history, including: Past medical problems. Family medical history. Pregnancy and menstrual history. History of falls. Current health, including: Memory and ability to understand (cognition). Emotional well-being. Home life and relationship well-being. Sexual activity and sexual health. Lifestyle, including: Alcohol, nicotine or tobacco, and drug use. Access to firearms. Diet, exercise, and sleep habits. Work and work Astronomer. Sunscreen use. Safety issues such as seatbelt and bike helmet use. Physical exam Your health care provider will check your: Height and weight. These may  be used to calculate your BMI (body mass index). BMI is a measurement that tells if you are at a healthy weight. Waist circumference. This measures the distance around your waistline. This measurement also tells if you are at a healthy weight and may help  predict your risk of certain diseases, such as type 2 diabetes and high blood pressure. Heart rate and blood pressure. Body temperature. Skin for abnormal spots. What immunizations do I need?  Vaccines are usually given at various ages, according to a schedule. Your health care provider will recommend vaccines for you based on your age, medical history, and lifestyle or other factors, such as travel or where you work. What tests do I need? Screening Your health care provider may recommend screening tests for certain conditions. This may include: Lipid and cholesterol levels. Hepatitis C test. Hepatitis B test. HIV (human immunodeficiency virus) test. STI (sexually transmitted infection) testing, if you are at risk. Lung cancer screening. Colorectal cancer screening. Diabetes screening. This is done by checking your blood sugar (glucose) after you have not eaten for a while (fasting). Mammogram. Talk with your health care provider about how often you should have regular mammograms. BRCA-related cancer screening. This may be done if you have a family history of breast, ovarian, tubal, or peritoneal cancers. Bone density scan. This is done to screen for osteoporosis. Talk with your health care provider about your test results, treatment options, and if necessary, the need for more tests. Follow these instructions at home: Eating and drinking  Eat a diet that includes fresh fruits and vegetables, whole grains, lean protein, and low-fat dairy products. Limit your intake of foods with high amounts of sugar, saturated fats, and salt. Take vitamin and mineral supplements as recommended by your health care provider. Do not drink alcohol if your health care provider tells you not to drink. If you drink alcohol: Limit how much you have to 0-1 drink a day. Know how much alcohol is in your drink. In the [place] one drink equals one 12 oz bottle of beer (355 mL), one 5 oz glass of wine (148 mL), or one  1 oz glass of hard liquor (44 mL). Lifestyle Brush your teeth every morning and night with fluoride toothpaste. Floss one time each day. Exercise for at least 30 minutes 5 or more days each week. Do not use any products that contain nicotine or tobacco. These products include cigarettes, chewing tobacco, and vaping devices, such as e-cigarettes. If you need help quitting, ask your health care provider. Do not use drugs. If you are sexually active, practice safe sex. Use a condom or other form of protection in order to prevent STIs. Take aspirin only as told by your health care provider. Make sure that you understand how much to take and what form to take. Work with your health care provider to find out whether it is safe and beneficial for you to take aspirin daily. Ask your health care provider if you need to take a cholesterol-lowering medicine (statin). Find healthy ways to manage stress, such as: Meditation, yoga, or listening to music. Journaling. Talking to a trusted person. Spending time with friends and family. Minimize exposure to UV radiation to reduce your risk of skin cancer. Safety Always wear your seat belt while driving or riding in a vehicle. Do not drive: If you have been drinking alcohol. Do not ride with someone who has been drinking. When you are tired or distracted. While texting. If you have been using any  mind-altering substances or drugs. Wear a helmet and other protective equipment during sports activities. If you have firearms in your house, make sure you follow all gun safety procedures. What's next? Visit your health care provider once a year for an annual wellness visit. Ask your health care provider how often you should have your eyes and teeth checked. Stay up to date on all vaccines. This information is not intended to replace advice given to you by your health care provider. Make sure you discuss any questions you have with your health care  provider. Document Revised: 11/16/2020 Document Reviewed: 11/16/2020 Elsevier Patient Education  2024 [employer]. Understanding Your Risk for Falls Millions of people have serious injuries from falls each year. It is important to understand your risk of falling. Talk with your health care provider about your risk and what you can do to lower it. If you do have a serious fall, make sure to tell your provider. Falling once raises your risk of falling again. How can falls affect me? Serious injuries from falls are common. These include: Broken bones, such as hip fractures. Head injuries, such as traumatic brain injuries (TBI) or concussions. A fear of falling can cause you to avoid activities and stay at home. This can make your muscles weaker and raise your risk for a fall. What can increase my risk? There are a number of risk factors that increase your risk for falling. The more risk factors you have, the higher your risk of falling. Serious injuries from a fall happen most often to people who are older than 66 years old. Teenagers and young adults ages 109-29 are also at higher risk. Common risk factors include: Weakness in the lower body. Being generally weak or confused due to long-term (chronic) illness. Dizziness or balance problems. Poor vision. Medicines that cause dizziness or drowsiness. These may include: Medicines for your blood pressure, heart, anxiety, insomnia, or swelling (edema). Pain medicines. Muscle relaxants. Other risk factors include: Drinking alcohol. Having had a fall in the past. Having foot pain or wearing improper footwear. Working at a dangerous job. Having any of the following in your home: Tripping hazards, such as floor clutter or loose rugs. Poor lighting. Pets. Having dementia or memory loss. What actions can I take to lower my risk of falling?     Physical activity Stay physically fit. Do strength and balance exercises. Consider taking a regular  class to build strength and balance. Yoga and tai chi are good options. Vision Have your eyes checked every year and your prescription for glasses or contacts updated as needed. Shoes and walking aids Wear non-skid shoes. Wear shoes that have rubber soles and low heels. Do not wear high heels. Do not walk around the house in socks or slippers. Use a cane or walker as told by your provider. Home safety Attach secure railings on both sides of your stairs. Install grab bars for your bathtub, shower, and toilet. Use a non-skid mat in your bathtub or shower. Attach bath mats securely with double-sided, non-slip rug tape. Use good lighting in all rooms. Keep a flashlight near your bed. Make sure there is a clear path from your bed to the bathroom. Use night-lights. Do not use throw rugs. Make sure all carpeting is taped or tacked down securely. Remove all clutter from walkways and stairways, including extension cords. Repair uneven or broken steps and floors. Avoid walking on icy or slippery surfaces. Walk on the grass instead of on icy or slick sidewalks.  Use ice melter to get rid of ice on walkways in the winter. Use a cordless phone. Questions to ask your health care provider Can you help me check my risk for a fall? Do any of my medicines make me more likely to fall? Should I take a vitamin D supplement? What exercises can I do to improve my strength and balance? Should I make an appointment to have my vision checked? Do I need a bone density test to check for weak bones (osteoporosis)? Would it help to use a cane or a walker? Where to find more information Centers for Disease Control and Prevention, STEADI: TonerPromos.no Community-Based Fall Prevention Programs: TonerPromos.no [employer] on Aging: BaseRingTones.pl Contact a health care provider if: You fall at home. You are afraid of falling at home. You feel weak, drowsy, or dizzy. This information is not intended to replace advice given to  you by your health care provider. Make sure you discuss any questions you have with your health care provider. Document Revised: 01/22/2022 Document Reviewed: 01/22/2022 Elsevier Patient Education  2024 [employer].

## 2023-05-08 NOTE — Progress Notes (Signed)
 Because this visit was a virtual/telehealth visit,  certain criteria was not obtained, such a blood pressure, CBG if applicable, and timed get up and go. Any medications not marked as "taking" were not mentioned during the medication reconciliation part of the visit. Any vitals not documented were not able to be obtained due to this being a telehealth visit or patient was unable to self-report a recent blood pressure reading due to a lack of equipment at home via telehealth. Vitals that have been documented are verbally provided by the patient.   Subjective:   Erin Griffin is a 67 y.o. female who presents for Medicare Annual (Subsequent) preventive examination.  Visit Complete: Virtual I connected with  Erin Griffin on 05/08/23 by a audio enabled telemedicine application and verified that I am speaking with the correct person using two identifiers.  Patient Location: Home  Provider Location: Home Office  I discussed the limitations of evaluation and management by telemedicine. The patient expressed understanding and agreed to proceed.  Vital Signs: Because this visit was a virtual/telehealth visit, some criteria may be missing or patient reported. Any vitals not documented were not able to be obtained and vitals that have been documented are patient reported.  Patient Medicare AWV questionnaire was completed by the patient on 05/04/2023; I have confirmed that all information answered by patient is correct and no changes since this date.  Cardiac Risk Factors include: advanced age (>89men, >54 women);dyslipidemia;hypertension;sedentary lifestyle;obesity (BMI >30kg/m2)     Objective:    Today's Vitals   05/08/23 0801  Weight: 179 lb (81.2 kg)  Height: 5\' 2"  (1.575 m)   Body mass index is 32.74 kg/m.     05/08/2023    8:01 AM 03/26/2022    8:15 AM  Advanced Directives  Does Patient Have a Medical Advance Directive? No Yes  Does patient want to make changes to medical advance  directive?  Yes (MAU/Ambulatory/Procedural Areas - Information given)  Would patient like information on creating a medical advance directive? No - Patient declined     Current Medications (verified) Outpatient Encounter Medications as of 05/08/2023  Medication Sig   eletriptan (RELPAX) 20 MG tablet Take 1 tablet (20 mg total) by mouth as needed for migraine or headache. May repeat in 2 hours if headache persists or recurs.   enalapril (VASOTEC) 10 MG tablet TAKE 1 TABLET(10 MG) BY MOUTH DAILY   simvastatin (ZOCOR) 40 MG tablet Take 1 tablet (40 mg total) by mouth at bedtime.   predniSONE (STERAPRED UNI-PAK 21 TAB) 10 MG (21) TBPK tablet Take following package directions   venlafaxine XR (EFFEXOR XR) 37.5 MG 24 hr capsule Take 1 capsule (37.5 mg total) by mouth daily with breakfast.   No facility-administered encounter medications on file as of 05/08/2023.    Allergies (verified) Shellfish allergy   History: Past Medical History:  Diagnosis Date   Heart murmur    Phreesia 06/19/2020   Hyperlipidemia    Phreesia 06/19/2020   Hypertension    Phreesia 06/19/2020   Migraine    no aura   Past Surgical History:  Procedure Laterality Date   APPENDECTOMY N/A    Phreesia 06/19/2020   OVARIAN CYST REMOVAL Right    Family History  Problem Relation Age of Onset   Breast cancer Mother    Breast cancer Sister    Social History   Socioeconomic History   Marital status: Married    Spouse name: Not on file   Number of children: Not on file  Years of education: Not on file   Highest education level: Some college, no degree  Occupational History   Occupation: Retired  Tobacco Use   Smoking status: Never   Smokeless tobacco: Never  Vaping Use   Vaping status: Never Used  Substance and Sexual Activity   Alcohol use: Yes    Alcohol/week: 2.0 standard drinks of alcohol    Types: 2 Glasses of wine per week    Comment: per week    Drug use: Never   Sexual activity: Yes  Other  Topics Concern   Not on file  Social History Narrative   Not on file   Social Determinants of Health   Financial Resource Strain: Low Risk  (05/04/2023)   Overall Financial Resource Strain (CARDIA)    Difficulty of Paying Living Expenses: Not hard at all  Food Insecurity: No Food Insecurity (05/04/2023)   Hunger Vital Sign    Worried About Running Out of Food in the Last Year: Never true    Ran Out of Food in the Last Year: Never true  Transportation Needs: No Transportation Needs (05/04/2023)   PRAPARE - Administrator, Civil Service (Medical): No    Lack of Transportation (Non-Medical): No  Physical Activity: Insufficiently Active (05/04/2023)   Exercise Vital Sign    Days of Exercise per Week: 2 days    Minutes of Exercise per Session: 60 min  Stress: No Stress Concern Present (05/04/2023)   [employer] of Occupational Health - Occupational Stress Questionnaire    Feeling of Stress : Not at all  Social Connections: Moderately Integrated (05/04/2023)   Social Connection and Isolation Panel [NHANES]    Frequency of Communication with Friends and Family: Three times a week    Frequency of Social Gatherings with Friends and Family: Once a week    Attends Religious Services: Never    Database administrator or Organizations: Yes    Attends Engineer, structural: More than 4 times per year    Marital Status: Married    Tobacco Counseling Counseling given: Yes   Clinical Intake:  Pre-visit preparation completed: Yes  Pain : No/denies pain     BMI - recorded: 32.74 Nutritional Status: BMI > 30  Obese Nutritional Risks: None Diabetes: No  How often do you need to have someone help you when you read instructions, pamphlets, or other written materials from your doctor or pharmacy?: 1 - Never  Interpreter Needed?: No  Information entered by ::  Erin Griffin, CMA   Activities of Daily Living    05/04/2023    4:39 PM  In your present state of  health, do you have any difficulty performing the following activities:  Hearing? 0  Vision? 0  Difficulty concentrating or making decisions? 0  Walking or climbing stairs? 0  Dressing or bathing? 0  Doing errands, shopping? 0  Preparing Food and eating ? N  Using the Toilet? N  In the past six months, have you accidently leaked urine? N  Do you have problems with loss of bowel control? N  Managing your Medications? N  Managing your Finances? N  Housekeeping or managing your Housekeeping? N    Patient Care Team: Gilmore Laroche, FNP as PCP - General (Family Medicine)  Indicate any recent Medical Services you may have received from other than [facility] providers in the past year (date may be approximate).     Assessment:   This is a routine wellness examination for Erin Griffin.  Hearing/Vision screen  Hearing Screening - Comments:: Patient denies any hearing difficulties.   Vision Screening - Comments:: Wears rx glasses - up to date with routine eye exams  Sees [facility] in [place]   Goals Addressed             This Visit's Progress    Patient Stated       I want to pick up the clarinet again.        Depression Screen    05/08/2023    8:04 AM 12/31/2022    1:17 PM 08/27/2022    1:08 PM 04/16/2022    8:05 AM 03/26/2022    8:19 AM 03/26/2022    8:07 AM 02/23/2022    1:08 PM  PHQ 2/9 Scores  PHQ - 2 Score 1 0 0 0 0 0 0  PHQ- 9 Score 1 0 0        Fall Risk    05/04/2023    4:39 PM 12/31/2022    1:17 PM 08/27/2022    1:08 PM 04/16/2022    8:05 AM 03/26/2022    8:19 AM  Fall Risk   Falls in the past year? 0 0 0 0 0  Number falls in past yr: 0 0 0 0 0  Injury with Fall? 0 0 0 0 0  Risk for fall due to : No Fall Risks No Fall Risks No Fall Risks No Fall Risks   Follow up Falls prevention discussed Falls evaluation completed Falls evaluation completed Falls evaluation completed     MEDICARE RISK AT HOME: Medicare Risk at Home Any stairs in or around the home?:  Yes If so, are there any without handrails?: No Home free of loose throw rugs in walkways, pet beds, electrical cords, etc?: Yes Adequate lighting in your home to reduce risk of falls?: Yes Life alert?: No Use of a cane, walker or w/c?: No Grab bars in the bathroom?: No Shower chair or bench in shower?: No Elevated toilet seat or a handicapped toilet?: No  TIMED UP AND GO:  Was the test performed?  No    Cognitive Function:        05/08/2023    8:04 AM 03/26/2022    8:19 AM  6CIT Screen  What Year? 0 points 0 points  What month? 0 points 0 points  What time? 0 points 0 points  Count back from 20 0 points 0 points  Months in reverse 0 points 0 points  Repeat phrase 0 points 2 points  Total Score 0 points 2 points    Immunizations Immunization History  Administered Date(s) Administered   Tdap 12/09/2017    TDAP status: Up to date  Flu Vaccine status: Due, Education has been provided regarding the importance of this vaccine. Advised may receive this vaccine at local pharmacy or Health Dept. Aware to provide a copy of the vaccination record if obtained from local pharmacy or Health Dept. Verbalized acceptance and understanding.  Pneumococcal vaccine status: Due, Education has been provided regarding the importance of this vaccine. Advised may receive this vaccine at local pharmacy or Health Dept. Aware to provide a copy of the vaccination record if obtained from local pharmacy or Health Dept. Verbalized acceptance and understanding.  Covid-19 vaccine status: Declined, Education has been provided regarding the importance of this vaccine but patient still declined. Advised may receive this vaccine at local pharmacy or Health Dept.or vaccine clinic. Aware to provide a copy of the vaccination record if obtained from local pharmacy or Health Dept.  Verbalized acceptance and understanding.  Qualifies for Shingles Vaccine? Yes   Zostavax completed No   Shingrix Completed?: No.     Education has been provided regarding the importance of this vaccine. Patient has been advised to call insurance company to determine out of pocket expense if they have not yet received this vaccine. Advised may also receive vaccine at local pharmacy or Health Dept. Verbalized acceptance and understanding.  Screening Tests Health Maintenance  Topic Date Due   Zoster Vaccines- Shingrix (1 of 2) Never done   Pneumonia Vaccine 84+ Years old (1 of 1 - PCV) Never done   INFLUENZA VACCINE  Never done   Medicare Annual Wellness (AWV)  03/27/2023   MAMMOGRAM  09/16/2024   Colonoscopy  09/08/2025   DTaP/Tdap/Td (2 - Td or Tdap) 12/10/2027   DEXA SCAN  Completed   Hepatitis C Screening  Completed   HPV VACCINES  Aged Out   COVID-19 Vaccine  Discontinued    Health Maintenance  Health Maintenance Due  Topic Date Due   Zoster Vaccines- Shingrix (1 of 2) Never done   Pneumonia Vaccine 52+ Years old (1 of 1 - PCV) Never done   INFLUENZA VACCINE  Never done   Medicare Annual Wellness (AWV)  03/27/2023    Colorectal cancer screening: Type of screening: Colonoscopy. Completed 09/09/2015. Repeat every 10 years  Mammogram status: Completed 09/17/2022. Repeat every year  Bone Density status: Completed 03/02/2022. Results reflect: Bone density results: NORMAL. Repeat every 5 years.  Lung Cancer Screening: (Low Dose CT Chest recommended if Age 83-80 years, 20 pack-year currently smoking OR have quit w/in 15years.) does not qualify.   Lung Cancer Screening Referral: na  Additional Screening:  Hepatitis C Screening: does not qualify; Completed   Vision Screening: Recommended annual ophthalmology exams for early detection of glaucoma and other disorders of the eye. Is the patient up to date with their annual eye exam?  Yes  Who is the provider or what is the name of the office in which the patient attends annual eye exams? [facility] [place] If pt is not established with a provider, would they  like to be referred to a provider to establish care? No .   Dental Screening: Recommended annual dental exams for proper oral hygiene  Diabetic Foot Exam: na  Community Resource Referral / Chronic Care Management: CRR required this visit?  No   CCM required this visit?  No     Plan:     I have personally reviewed and noted the following in the patient's chart:   Medical and social history Use of alcohol, tobacco or illicit drugs  Current medications and supplements including opioid prescriptions. Patient is not currently taking opioid prescriptions. Functional ability and status Nutritional status Physical activity Advanced directives List of other physicians Hospitalizations, surgeries, and ER visits in previous 12 months Vitals Screenings to include cognitive, depression, and falls Referrals and appointments  In addition, I have reviewed and discussed with patient certain preventive protocols, quality metrics, and best practice recommendations. A written personalized care plan for preventive services as well as general preventive health recommendations were provided to patient.     Erin Griffin, CMA   05/08/2023   After Visit Summary: (MyChart) Due to this being a telephonic visit, the after visit summary with patients personalized plan was offered to patient via MyChart

## 2023-05-21 ENCOUNTER — Ambulatory Visit (INDEPENDENT_AMBULATORY_CARE_PROVIDER_SITE_OTHER): Payer: Medicare Other | Admitting: Family Medicine

## 2023-05-21 ENCOUNTER — Encounter: Payer: Self-pay | Admitting: Family Medicine

## 2023-05-21 VITALS — BP 138/78 | HR 88 | Ht 62.0 in | Wt 175.0 lb

## 2023-05-21 DIAGNOSIS — R7303 Prediabetes: Secondary | ICD-10-CM

## 2023-05-21 DIAGNOSIS — E7849 Other hyperlipidemia: Secondary | ICD-10-CM | POA: Diagnosis not present

## 2023-05-21 DIAGNOSIS — E038 Other specified hypothyroidism: Secondary | ICD-10-CM

## 2023-05-21 DIAGNOSIS — R7301 Impaired fasting glucose: Secondary | ICD-10-CM

## 2023-05-21 DIAGNOSIS — E559 Vitamin D deficiency, unspecified: Secondary | ICD-10-CM | POA: Diagnosis not present

## 2023-05-21 DIAGNOSIS — I10 Essential (primary) hypertension: Secondary | ICD-10-CM | POA: Diagnosis not present

## 2023-05-21 NOTE — Patient Instructions (Signed)
I appreciate the opportunity to provide care to you today!    Follow up:  4 months  Labs: please stop by the lab today/during the week to get your blood drawn (CBC, CMP, TSH, Lipid profile, HgA1c, Vit D)  For managing prediabetes, I recommend the following lifestyle changes:  Reduce Intake of High-Sugar Foods and Beverages: Limit foods and drinks high in sugar to help regulate blood sugar levels. Increase Consumption of Nutrient-Rich Foods: Focus on incorporating more fruits, vegetables, and whole grains into your diet. Choose Lean Proteins: Opt for lean proteins such as chicken, fish, beans, and legumes. Select Low-Fat Dairy Products: Choose low-fat or non-fat dairy options. Minimize Saturated Fats, Trans Fats, and Cholesterol: Reduce intake of foods high in saturated fats, trans fatty acids, and cholesterol. Engage in Regular Physical Activity: Aim for at least 30 minutes of brisk walking or other moderate activity at least 5 days a week.      Please continue to a heart-healthy diet and increase your physical activities. Try to exercise for at least five days a week.    It was a pleasure to see you and I look forward to continuing to work together on your health and well-being. Please do not hesitate to call the office if you need care or have questions about your care.  In case of emergency, please visit the Emergency Department for urgent care, or contact our clinic at [phone] to schedule an appointment. We're here to help you!   Have a wonderful day and week. With Gratitude, Gilmore Laroche MSN, FNP-BC

## 2023-05-21 NOTE — Assessment & Plan Note (Signed)
Controlled She takes enalapril 10 mg daily Denies headaches, dizziness, blurred vision, chest pain, palpitation Low-sodium diet with increased physical activity encouraged  BP Readings from Last 3 Encounters:  05/21/23 138/78  12/31/22 138/88  08/27/22 138/74

## 2023-05-21 NOTE — Assessment & Plan Note (Signed)
She takes simvastatin 20 mg daily Denies muscle aches, abdominal pain, nausea vomiting Encouraged a heart healthy diet with increased physical activity Lab Results  Component Value Date   CHOL 164 01/11/2023   HDL 55 01/11/2023   LDLCALC 86 01/11/2023   TRIG 134 01/11/2023   CHOLHDL 3.0 01/11/2023

## 2023-05-21 NOTE — Progress Notes (Signed)
Established Patient Office Visit  Subjective:  Patient ID: Erin Griffin, female    DOB: Mar 22, 1956  Age: 66 y.o. MRN: [id number]  CC:  Chief Complaint  Patient presents with   Care Management    4 month f/u    HPI Erin Griffin is a 67 y.o. female with past medical history of essential hypertension, prediabetes, hyperlipidemia presents for f/u of  chronic medical conditions. For the details of today's visit, please refer to the assessment and plan.     Past Medical History:  Diagnosis Date   Heart murmur    Phreesia 06/19/2020   Hyperlipidemia    [facility]reesia 06/19/2020   Hypertension    Phreesia 06/19/2020   Migraine    no aura    Past Surgical History:  Procedure Laterality Date   APPENDECTOMY N/A    Phreesia 06/19/2020   OVARIAN CYST REMOVAL Right     Family History  Problem Relation Age of Onset   Breast cancer Mother    Breast cancer Sister     Social History   Socioeconomic History   Marital status: Married    Spouse name: Not on file   Number of children: Not on file   Years of education: Not on file   Highest education level: Some college, no degree  Occupational History   Occupation: Retired  Tobacco Use   Smoking status: Never   Smokeless tobacco: Never  Vaping Use   Vaping status: Never Used  Substance and Sexual Activity   Alcohol use: Yes    Alcohol/week: 2.0 standard drinks of alcohol    Types: 2 Glasses of wine per week    Comment: per week    Drug use: Never   Sexual activity: Yes  Other Topics Concern   Not on file  Social History Narrative   Not on file   Social Drivers of Health   Financial Resource Strain: Low Risk  (05/18/2023)   Overall Financial Resource Strain (CARDIA)    Difficulty of Paying Living Expenses: Not hard at all  Food Insecurity: No Food Insecurity (05/18/2023)   Hunger Vital Sign    Worried About Running Out of Food in the Last Year: Never true    Ran Out of Food in the Last Year: Never true  Transportation  Needs: No Transportation Needs (05/18/2023)   PRAPARE - Administrator, Civil Service (Medical): No    Lack of Transportation (Non-Medical): No  Physical Activity: Insufficiently Active (05/18/2023)   Exercise Vital Sign    Days of Exercise per Week: 2 days    Minutes of Exercise per Session: 60 min  Stress: No Stress Concern Present (05/18/2023)   [employer] of Occupational Health - Occupational Stress Questionnaire    Feeling of Stress : Not at all  Social Connections: Socially Integrated (05/18/2023)   Social Connection and Isolation Panel [NHANES]    Frequency of Communication with Friends and Family: Twice a week    Frequency of Social Gatherings with Friends and Family: Once a week    Attends Religious Services: More than 4 times per year    Active Member of [employer] or Organizations: Yes    Attends Engineer, structural: More than 4 times per year    Marital Status: Married  Catering manager Violence: Not At Risk (05/08/2023)   Humiliation, Afraid, Rape, and Kick questionnaire    Fear of Current or Ex-Partner: No    Emotionally Abused: No    Physically Abused: No  Sexually Abused: No    Outpatient Medications Prior to Visit  Medication Sig Dispense Refill   eletriptan (RELPAX) 20 MG tablet Take 1 tablet (20 mg total) by mouth as needed for migraine or headache. May repeat in 2 hours if headache persists or recurs. 15 tablet 0   enalapril (VASOTEC) 10 MG tablet TAKE 1 TABLET(10 MG) BY MOUTH DAILY 30 tablet 3   simvastatin (ZOCOR) 40 MG tablet Take 1 tablet (40 mg total) by mouth at bedtime. 90 tablet 3   predniSONE (STERAPRED UNI-PAK 21 TAB) 10 MG (21) TBPK tablet Take following package directions 21 tablet 0   venlafaxine XR (EFFEXOR XR) 37.5 MG 24 hr capsule Take 1 capsule (37.5 mg total) by mouth daily with breakfast. 90 capsule 1   No facility-administered medications prior to visit.    Allergies  Allergen Reactions   Shellfish Allergy Hives,  Itching and Swelling    ROS Review of Systems  Constitutional:  Negative for chills and fever.  Eyes:  Negative for visual disturbance.  Respiratory:  Negative for chest tightness and shortness of breath.   Neurological:  Negative for dizziness and headaches.      Objective:    Physical Exam HENT:     Head: Normocephalic.     Mouth/Throat:     Mouth: Mucous membranes are moist.  Cardiovascular:     Rate and Rhythm: Normal rate.     Heart sounds: Normal heart sounds.  Pulmonary:     Effort: Pulmonary effort is normal.     Breath sounds: Normal breath sounds.  Neurological:     Mental Status: She is alert.     BP 138/78 (BP Location: Left Arm)   Pulse 88   Ht 5\' 2"  (1.575 m)   Wt 175 lb 0.6 oz (79.4 kg)   SpO2 97%   BMI 32.02 kg/m  Wt Readings from Last 3 Encounters:  05/21/23 175 lb 0.6 oz (79.4 kg)  05/08/23 179 lb (81.2 kg)  12/31/22 178 lb 1.3 oz (80.8 kg)    Lab Results  Component Value Date   TSH 1.710 01/11/2023   Lab Results  Component Value Date   WBC 5.7 01/11/2023   HGB 13.8 01/11/2023   HCT 40.4 01/11/2023   MCV 94 01/11/2023   PLT 211 01/11/2023   Lab Results  Component Value Date   NA 142 01/11/2023   K 4.7 01/11/2023   CO2 23 01/11/2023   GLUCOSE 109 (H) 01/11/2023   BUN 17 01/11/2023   CREATININE 0.72 01/11/2023   BILITOT 0.5 01/11/2023   ALKPHOS 97 01/11/2023   AST 20 01/11/2023   ALT 20 01/11/2023   PROT 6.4 01/11/2023   ALBUMIN 4.4 01/11/2023   CALCIUM 9.6 01/11/2023   EGFR 92 01/11/2023   Lab Results  Component Value Date   CHOL 164 01/11/2023   Lab Results  Component Value Date   HDL 55 01/11/2023   Lab Results  Component Value Date   LDLCALC 86 01/11/2023   Lab Results  Component Value Date   TRIG 134 01/11/2023   Lab Results  Component Value Date   CHOLHDL 3.0 01/11/2023   Lab Results  Component Value Date   HGBA1C 6.1 (H) 01/11/2023      Assessment & Plan:  Hypertension, essential Assessment &  Plan: Controlled She takes enalapril 10 mg daily Denies headaches, dizziness, blurred vision, chest pain, palpitation Low-sodium diet with increased physical activity encouraged  BP Readings from Last 3 Encounters:  05/21/23 138/78  12/31/22 138/88  08/27/22 138/74       Other hyperlipidemia Assessment & Plan: She takes simvastatin 20 mg daily Denies muscle aches, abdominal pain, nausea vomiting Encouraged a heart healthy diet with increased physical activity Lab Results  Component Value Date   CHOL 164 01/11/2023   HDL 55 01/11/2023   LDLCALC 86 01/11/2023   TRIG 134 01/11/2023   CHOLHDL 3.0 01/11/2023     Orders: -     Lipid panel -     CMP14+EGFR -     CBC with Differential/Platelet  Prediabetes Assessment & Plan: The patient was encouraged to decrease her intake of high-sugar foods and beverages while increasing her physical activity to improve overall health and well-being. She verbalized understanding and is aware of the plan of care. Lab Results  Component Value Date   HGBA1C 6.1 (H) 01/11/2023         IFG (impaired fasting glucose) -     Hemoglobin A1c  Vitamin D deficiency -     VITAMIN D 25 Hydroxy (Vit-D Deficiency, Fractures)  TSH (thyroid-stimulating hormone deficiency) -     TSH + free T4  Note: This chart has been completed using Engineer, civil (consulting) software, and while attempts have been made to ensure accuracy, certain words and phrases may not be transcribed as intended.    Follow-up: Return in about 4 months (around 09/19/2023).   Gilmore Laroche, FNP

## 2023-05-21 NOTE — Assessment & Plan Note (Addendum)
The patient was encouraged to decrease her intake of high-sugar foods and beverages while increasing her physical activity to improve overall health and well-being. She verbalized understanding and is aware of the plan of care. Lab Results  Component Value Date   HGBA1C 6.1 (H) 01/11/2023

## 2023-05-31 ENCOUNTER — Other Ambulatory Visit: Payer: Self-pay | Admitting: Family Medicine

## 2023-05-31 DIAGNOSIS — I10 Essential (primary) hypertension: Secondary | ICD-10-CM

## 2023-06-11 ENCOUNTER — Other Ambulatory Visit: Payer: Self-pay | Admitting: Family Medicine

## 2023-06-11 DIAGNOSIS — G43809 Other migraine, not intractable, without status migrainosus: Secondary | ICD-10-CM

## 2023-06-12 MED ORDER — ELETRIPTAN HYDROBROMIDE 20 MG PO TABS: 20.0000 mg | ORAL_TABLET | ORAL | 0 refills | Status: DC | PRN

## 2023-06-21 ENCOUNTER — Other Ambulatory Visit: Payer: Self-pay | Admitting: Family Medicine

## 2023-06-21 DIAGNOSIS — E559 Vitamin D deficiency, unspecified: Secondary | ICD-10-CM

## 2023-06-21 LAB — CMP14+EGFR
ALT: 18 [IU]/L (ref 0–32)
AST: 21 [IU]/L (ref 0–40)
Albumin: 4.4 g/dL (ref 3.9–4.9)
Alkaline Phosphatase: 110 [IU]/L (ref 44–121)
BUN/Creatinine Ratio: 28 (ref 12–28)
BUN: 20 mg/dL (ref 8–27)
Bilirubin Total: 0.6 mg/dL (ref 0.0–1.2)
CO2: 23 mmol/L (ref 20–29)
Calcium: 9.3 mg/dL (ref 8.7–10.3)
Chloride: 105 mmol/L (ref 96–106)
Creatinine, Ser: 0.71 mg/dL (ref 0.57–1.00)
Globulin, Total: 2.3 g/dL (ref 1.5–4.5)
Glucose: 108 mg/dL — ABNORMAL HIGH (ref 70–99)
Potassium: 4.4 mmol/L (ref 3.5–5.2)
Sodium: 142 mmol/L (ref 134–144)
Total Protein: 6.7 g/dL (ref 6.0–8.5)
eGFR: 94 mL/min/{1.73_m2} (ref >59)

## 2023-06-21 LAB — CBC WITH DIFFERENTIAL/PLATELET
Basophils Absolute: 0.1 10*3/uL (ref 0.0–0.2)
Basos: 1 %
EOS (ABSOLUTE): 0.2 10*3/uL (ref 0.0–0.4)
Eos: 3 %
Hematocrit: 41.3 % (ref 34.0–46.6)
Hemoglobin: 13.9 g/dL (ref 11.1–15.9)
Immature Grans (Abs): 0 10*3/uL (ref 0.0–0.1)
Immature Granulocytes: 0 %
Lymphocytes Absolute: 1.9 10*3/uL (ref 0.7–3.1)
Lymphs: 31 %
MCH: 31.4 pg (ref 26.6–33.0)
MCHC: 33.7 g/dL (ref 31.5–35.7)
MCV: 93 fL (ref 79–97)
Monocytes Absolute: 0.5 10*3/uL (ref 0.1–0.9)
Monocytes: 8 %
Neutrophils Absolute: 3.4 10*3/uL (ref 1.4–7.0)
Neutrophils: 57 %
Platelets: 237 10*3/uL (ref 150–450)
RBC: 4.43 x10E6/uL (ref 3.77–5.28)
RDW: 12.4 % (ref 11.7–15.4)
WBC: 5.9 10*3/uL (ref 3.4–10.8)

## 2023-06-21 LAB — HEMOGLOBIN A1C
Est. average glucose Bld gHb Est-mCnc: 128 mg/dL
Hgb A1c MFr Bld: 6.1 % — ABNORMAL HIGH (ref 4.8–5.6)

## 2023-06-21 LAB — LIPID PANEL
Chol/HDL Ratio: 3.4 {ratio} (ref 0.0–4.4)
Cholesterol, Total: 190 mg/dL (ref 100–199)
HDL: 56 mg/dL (ref >39)
LDL Chol Calc (NIH): 112 mg/dL — ABNORMAL HIGH (ref 0–99)
Triglycerides: 127 mg/dL (ref 0–149)
VLDL Cholesterol Cal: 22 mg/dL (ref 5–40)

## 2023-06-21 LAB — TSH+FREE T4
Free T4: 1.17 ng/dL (ref 0.82–1.77)
TSH: 1.94 u[IU]/mL (ref 0.450–4.500)

## 2023-06-21 LAB — VITAMIN D 25 HYDROXY (VIT D DEFICIENCY, FRACTURES): Vit D, 25-Hydroxy: 28.6 ng/mL — ABNORMAL LOW (ref 30.0–100.0)

## 2023-06-21 MED ORDER — VITAMIN D (ERGOCALCIFEROL) 1.25 MG (50000 UNIT) PO CAPS: 50000.0000 [IU] | ORAL_CAPSULE | ORAL | 1 refills | Status: DC

## 2023-06-21 NOTE — Progress Notes (Signed)
Please inform the patient that a prescription for a weekly vitamin D supplement has been sent to her pharmacy due to low vitamin D levels.  Her cholesterol levels are elevated, and I recommend that she continue taking simvastatin 40 mg daily; the prescription has been sent to her pharmacy. I also recommend reducing her intake of greasy, fatty, and starchy foods to help lower her cholesterol levels.  The labs indicate prediabetes, and I recommend decreasing her intake of high-sugar foods and beverages while increasing physical activity.  All other labs are stable.

## 2023-08-06 ENCOUNTER — Other Ambulatory Visit (HOSPITAL_COMMUNITY): Payer: Self-pay | Admitting: Family Medicine

## 2023-08-06 DIAGNOSIS — Z1231 Encounter for screening mammogram for malignant neoplasm of breast: Secondary | ICD-10-CM

## 2023-09-12 ENCOUNTER — Other Ambulatory Visit: Payer: Self-pay | Admitting: Family Medicine

## 2023-09-12 DIAGNOSIS — G43809 Other migraine, not intractable, without status migrainosus: Secondary | ICD-10-CM

## 2023-09-12 MED ORDER — ELETRIPTAN HYDROBROMIDE 20 MG PO TABS: 20.0000 mg | ORAL_TABLET | ORAL | 0 refills | Status: DC | PRN

## 2023-09-19 ENCOUNTER — Encounter (HOSPITAL_COMMUNITY): Payer: Self-pay

## 2023-09-19 ENCOUNTER — Ambulatory Visit (HOSPITAL_COMMUNITY)
Admission: RE | Admit: 2023-09-19 | Discharge: 2023-09-19 | Disposition: A | Discharge status: Home / Self Care | Source: Ambulatory Visit | Attending: Family Medicine | Admitting: Family Medicine

## 2023-09-19 DIAGNOSIS — Z1231 Encounter for screening mammogram for malignant neoplasm of breast: Secondary | ICD-10-CM | POA: Diagnosis present

## 2023-09-20 IMAGING — MG MM DIGITAL SCREENING BILAT W/ TOMO AND CAD
8 series · 8 of 24 positions shown · non-contrast
Comparison: Previous exam(s).

CLINICAL DATA: Screening.

EXAM:
DIGITAL SCREENING BILATERAL MAMMOGRAM WITH TOMOSYNTHESIS AND CAD
TECHNIQUE: Bilateral screening digital craniocaudal and mediolateral oblique
mammograms were obtained. Bilateral screening digital breast
tomosynthesis was performed. The images were evaluated with
computer-aided detection.

[L MLO synth-2D]
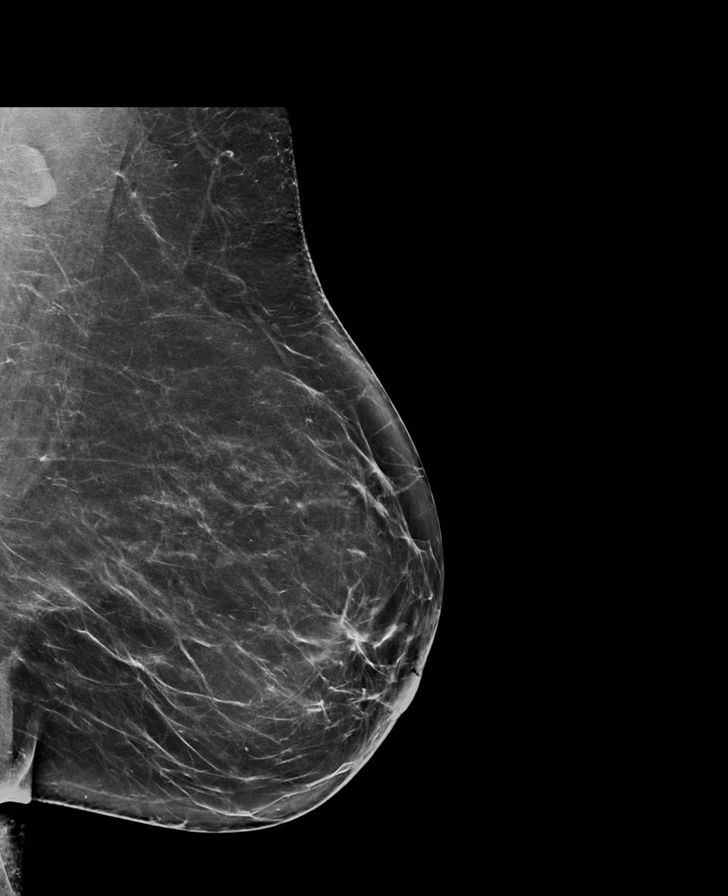

[R CC synth-2D]
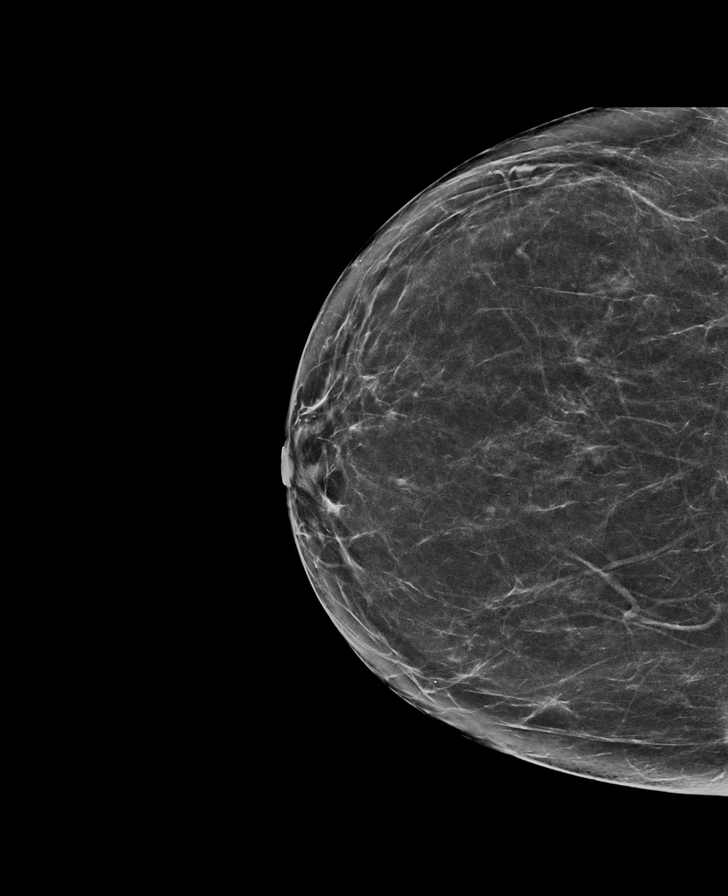

[R MLO synth-2D]
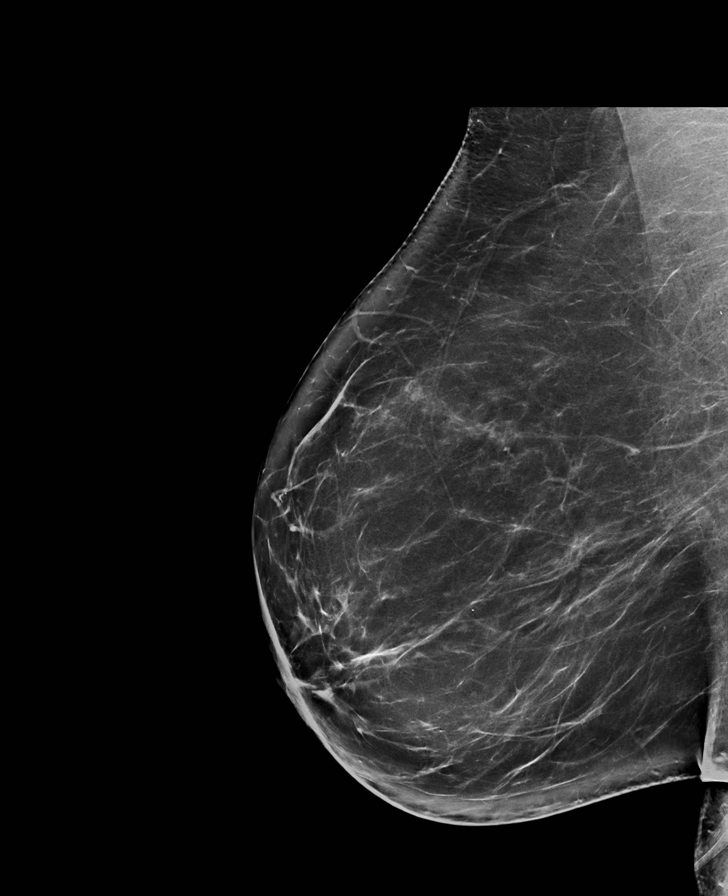

[L CC synth-2D]
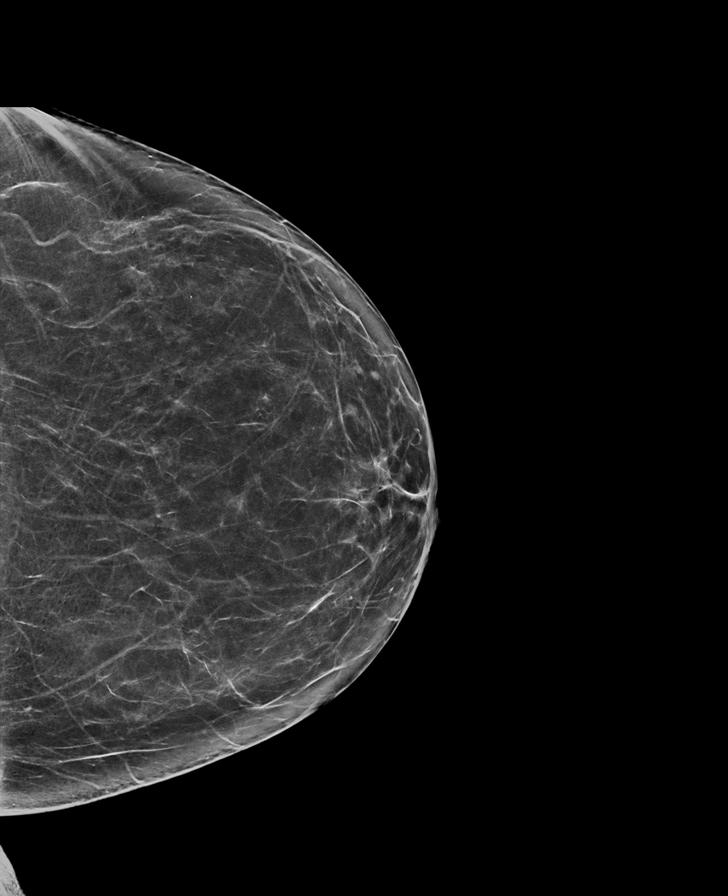

[R CC tomo · tomo slice 36/71.0]
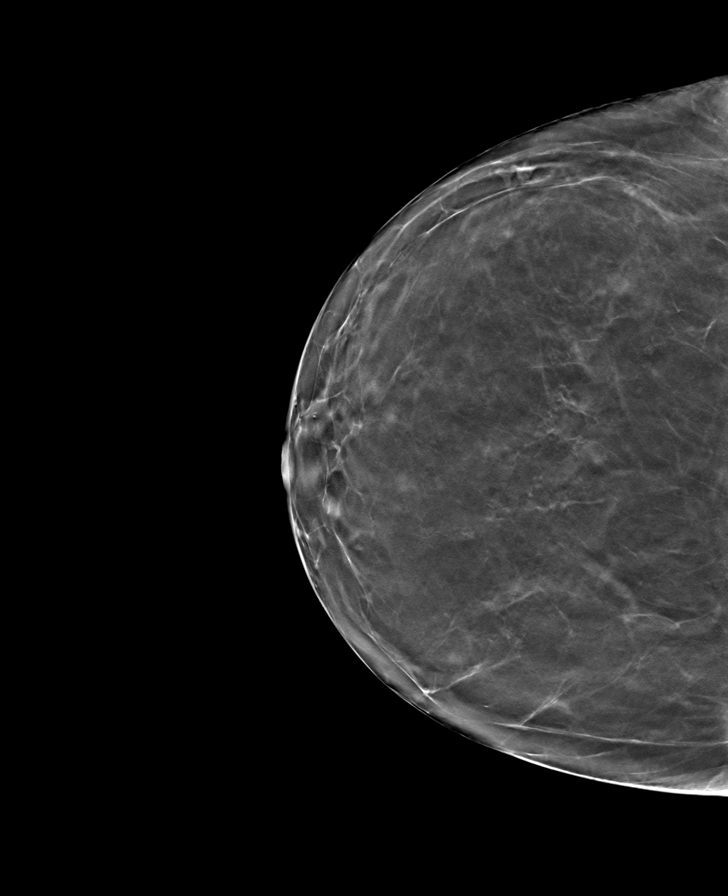

[L CC tomo · tomo slice 37/74.0]
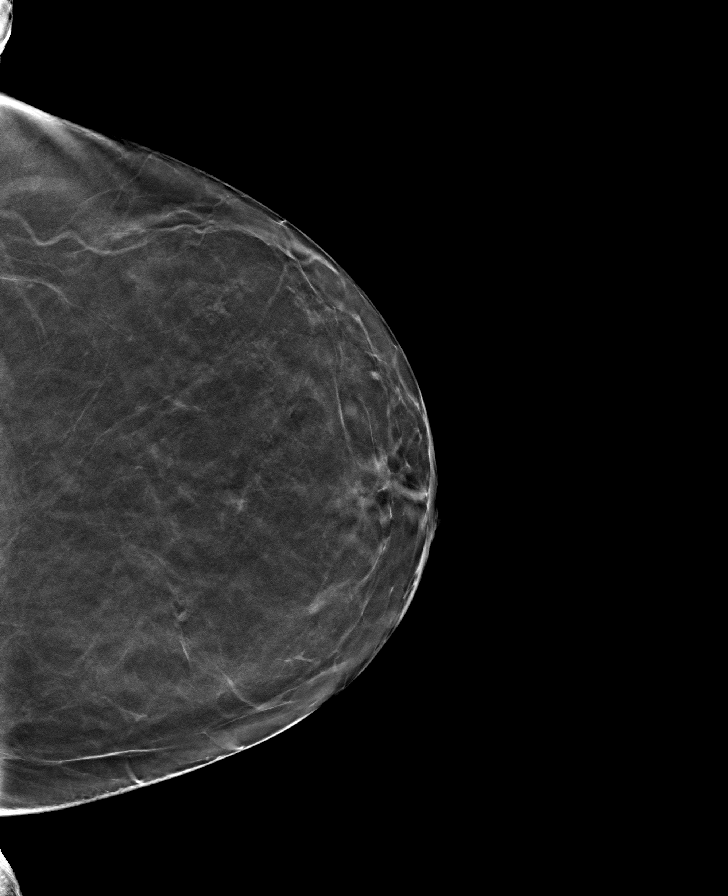

[R MLO tomo · tomo slice 43/86.0]
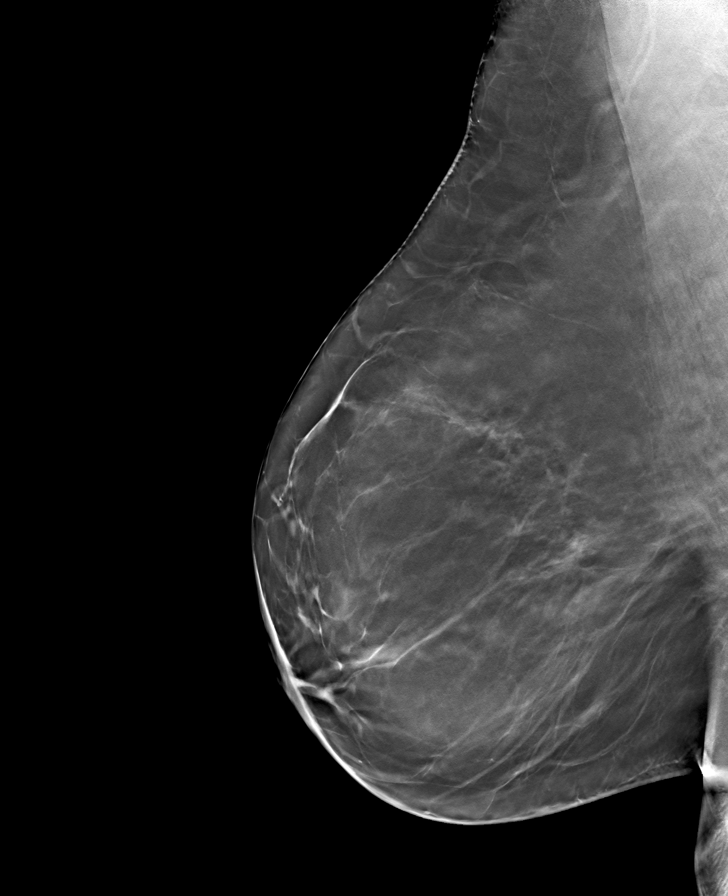

[L MLO tomo · tomo slice 41/82.0]
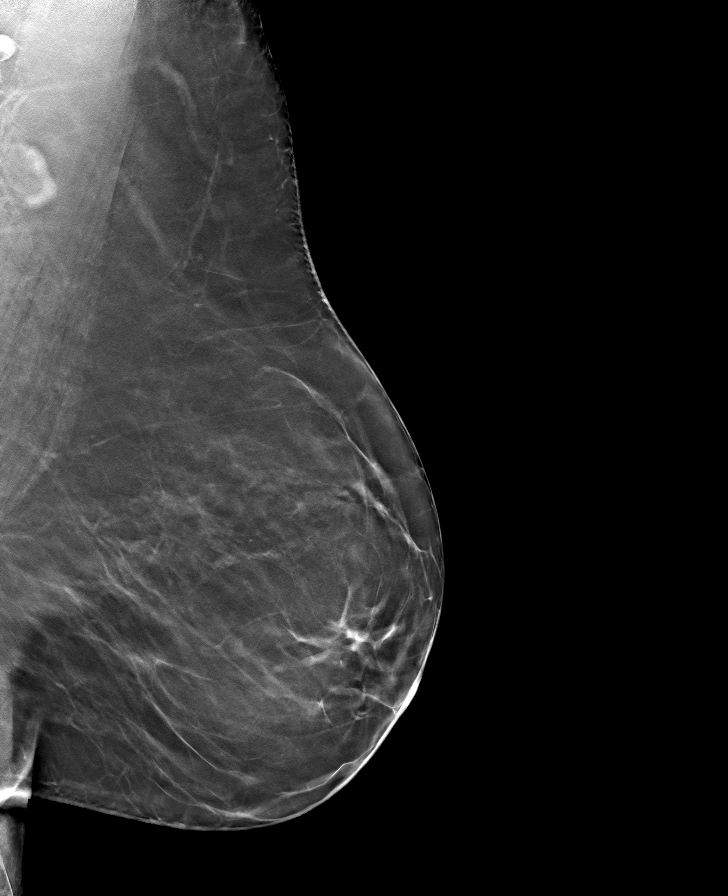

[8 of 24 positions shown; findings below may reference images not displayed]

ACR Breast Density Category b: There are scattered areas of
fibroglandular density.
FINDINGS: There are no findings suspicious for malignancy.
IMPRESSION: No mammographic evidence of malignancy. A result letter of this
screening mammogram will be mailed directly to the patient.

RECOMMENDATION:
Screening mammogram in one year. (Code:51-O-LD2)

BI-RADS CATEGORY  1: Negative.

## 2023-09-23 ENCOUNTER — Encounter: Payer: Self-pay | Admitting: Family Medicine

## 2023-09-23 ENCOUNTER — Ambulatory Visit (INDEPENDENT_AMBULATORY_CARE_PROVIDER_SITE_OTHER): Payer: BC Managed Care – PPO | Admitting: Family Medicine

## 2023-09-23 VITALS — BP 137/81 | HR 82 | Ht 62.0 in | Wt 171.0 lb

## 2023-09-23 DIAGNOSIS — E7849 Other hyperlipidemia: Secondary | ICD-10-CM

## 2023-09-23 DIAGNOSIS — R7303 Prediabetes: Secondary | ICD-10-CM | POA: Diagnosis not present

## 2023-09-23 DIAGNOSIS — I10 Essential (primary) hypertension: Secondary | ICD-10-CM

## 2023-09-23 DIAGNOSIS — E559 Vitamin D deficiency, unspecified: Secondary | ICD-10-CM

## 2023-09-23 DIAGNOSIS — E038 Other specified hypothyroidism: Secondary | ICD-10-CM

## 2023-09-23 DIAGNOSIS — G43809 Other migraine, not intractable, without status migrainosus: Secondary | ICD-10-CM | POA: Diagnosis not present

## 2023-09-23 DIAGNOSIS — R7301 Impaired fasting glucose: Secondary | ICD-10-CM

## 2023-09-23 MED ORDER — VITAMIN D (ERGOCALCIFEROL) 1.25 MG (50000 UNIT) PO CAPS: 50000.0000 [IU] | ORAL_CAPSULE | ORAL | 1 refills | Status: DC

## 2023-09-23 MED ORDER — ELETRIPTAN HYDROBROMIDE 20 MG PO TABS: 20.0000 mg | ORAL_TABLET | ORAL | 0 refills | Status: DC | PRN

## 2023-09-23 NOTE — Patient Instructions (Addendum)
 I appreciate the opportunity to provide care to you today!    Follow up:  4 months  Labs: please stop by the lab during the week to get your blood drawn (CBC, CMP, TSH, Lipid profile, HgA1c, Vit D)  Target Heart Rate Zones (Based on % of Maximum Heart Rate(MHR): Exercise Intensity % of MHR Target Heart Rate (Age 67 Example) Light                              50-60%                              90-108 bpm Moderate                         60-70%                              108-126 bpm Vigorous                  70-85%                              126-153 bpm  UpToDate Guidance Summary: For moderate-intensity activity (e.g., brisk walking), aim for 50-70% of MHR. For vigorous-intensity activity (e.g., running), aim for 70-85% of MHR.  The "talk test" is also helpful: If you can talk but not sing, it's moderate intensity. If you can't say more than a few words without pausing for breath, it's vigorous intensity.     Here are some foods to avoid or reduce in your diet to help manage cholesterol levels:  Fried Foods:Deep-fried items such as french fries, fried chicken, and fried snacks are high in unhealthy fats and can raise LDL (bad) cholesterol levels. Processed Meats:Foods like bacon, sausage, hot dogs, and deli meats are often high in saturated fat and cholesterol. Full-Fat Dairy Products:Whole milk, full-fat yogurt, butter, cream, and cheese are rich in saturated fats, which can increase cholesterol levels. Baked Goods and Sweets:Pastries, cakes, cookies, and donuts often contain trans fats and added sugars, which can raise LDL cholesterol and lower HDL (good) cholesterol. Red Meat:Beef, lamb, and pork are high in saturated fat. Lean cuts or plant-based protein alternatives are better options. Lard and Shortening:Used in some baked goods, lard and shortening are high in trans fats and should be avoided. Fast Food:Many fast food items are cooked with unhealthy oils and contain high  amounts of saturated and trans fats. Processed Snacks:Chips, crackers, and certain microwave popcorns can contain trans fats and high levels of unhealthy oils. Shellfish:While nutritious in other ways, some shellfish like shrimp, lobster, and crab are high in cholesterol. They should be consumed in moderation. Coconut and Palm Oils:these oils are high in saturated fat and can raise cholesterol levels when used in cooking or baking.   .   Please continue to a heart-healthy diet and increase your physical activities. Try to exercise for at least five days a week.    It was a pleasure to see you and I look forward to continuing to work together on your health and well-being. Please do not hesitate to call the office if you need care or have questions about your care.  In case of emergency, please visit the Emergency Department for urgent care, or  contact our clinic at [phone] to schedule an appointment. We're here to help you!   Have a wonderful day and week. With Gratitude, Graycee Greeson MSN, FNP-BC

## 2023-09-23 NOTE — Progress Notes (Unsigned)
 Established Patient Office Visit  Subjective:  Patient ID: Erin Griffin, female    DOB: 04-13-1956  Age: 68 y.o. MRN: [id number]  CC:  Chief Complaint  Patient presents with   Medical Management of Chronic Issues    4 month f/u  Review mammogram     HPI Erin Griffin is a 68 y.o. female with past medical history of hypertension and migraines presents for f/u of chronic medical conditions. For the details of today's visit, please refer to the assessment and plan.    The patient declined the shingles vaccine.  Past Medical History:  Diagnosis Date   Heart murmur    [facility]reesia 06/19/2020   Hyperlipidemia    [facility]ia 06/19/2020   Hypertension    [facility]reesia 06/19/2020   Migraine    no aura    Past Surgical History:  Procedure Laterality Date   APPENDECTOMY N/A    [facility]reesia 06/19/2020   OVARIAN CYST REMOVAL Right     Family History  Problem Relation Age of Onset   Breast cancer Mother    Breast cancer Sister     Social History   Socioeconomic History   Marital status: Married    Spouse name: Not on file   Number of children: Not on file   Years of education: Not on file   Highest education level: Some college, no degree  Occupational History   Occupation: Retired  Tobacco Use   Smoking status: Never   Smokeless tobacco: Never  Vaping Use   Vaping status: Never Used  Substance and Sexual Activity   Alcohol use: Yes    Alcohol/week: 2.0 standard drinks of alcohol    Types: 2 Glasses of wine per week    Comment: socially   Drug use: Never   Sexual activity: Yes  Other Topics Concern   Not on file  Social History Narrative   Not on file   Social Drivers of Health   Financial Resource Strain: Low Risk  (05/18/2023)   Overall Financial Resource Strain (CARDIA)    Difficulty of Paying Living Expenses: Not hard at all  Food Insecurity: No Food Insecurity (05/18/2023)   Hunger Vital Sign    Worried About Running Out of Food in the Last Year: Never true    Ran  Out of Food in the Last Year: Never true  Transportation Needs: No Transportation Needs (05/18/2023)   PRAPARE - Administrator, Civil Service (Medical): No    Lack of Transportation (Non-Medical): No  Physical Activity: Insufficiently Active (05/18/2023)   Exercise Vital Sign    Days of Exercise per Week: 2 days    Minutes of Exercise per Session: 60 min  Stress: No Stress Concern Present (05/18/2023)   [employer] of Occupational Health - Occupational Stress Questionnaire    Feeling of Stress : Not at all  Social Connections: Socially Integrated (05/18/2023)   Social Connection and Isolation Panel [NHANES]    Frequency of Communication with Friends and Family: Twice a week    Frequency of Social Gatherings with Friends and Family: Once a week    Attends Religious Services: More than 4 times per year    Active Member of [employer] or Organizations: Yes    Attends Engineer, structural: More than 4 times per year    Marital Status: Married  Catering manager Violence: Not At Risk (05/08/2023)   Humiliation, Afraid, Rape, and Kick questionnaire    Fear of Current or Ex-Partner: No    Emotionally Abused:  No    Physically Abused: No    Sexually Abused: No    Outpatient Medications Prior to Visit  Medication Sig Dispense Refill   enalapril  (VASOTEC ) 10 MG tablet TAKE 1 TABLET(10 MG) BY MOUTH DAILY 30 tablet 3   simvastatin  (ZOCOR ) 40 MG tablet Take 1 tablet (40 mg total) by mouth at bedtime. 90 tablet 3   eletriptan  (RELPAX ) 20 MG tablet Take 1 tablet (20 mg total) by mouth as needed for migraine or headache. May repeat in 2 hours if headache persists or recurs. 15 tablet 0   Vitamin D , Ergocalciferol , (DRISDOL ) 1.25 MG (50000 UNIT) CAPS capsule Take 1 capsule (50,000 Units total) by mouth every 7 (seven) days. 20 capsule 1   predniSONE  (STERAPRED UNI-PAK 21 TAB) 10 MG (21) TBPK tablet Take following package directions 21 tablet 0   venlafaxine  XR (EFFEXOR  XR) 37.5 MG  24 hr capsule Take 1 capsule (37.5 mg total) by mouth daily with breakfast. 90 capsule 1   No facility-administered medications prior to visit.    Allergies  Allergen Reactions   Shellfish Allergy Hives, Itching and Swelling    ROS Review of Systems  Constitutional:  Negative for chills and fever.  Eyes:  Negative for visual disturbance.  Respiratory:  Negative for chest tightness and shortness of breath.   Neurological:  Negative for dizziness and headaches.      Objective:    Physical Exam HENT:     Head: Normocephalic.     Mouth/Throat:     Mouth: Mucous membranes are moist.  Cardiovascular:     Rate and Rhythm: Normal rate.     Heart sounds: Normal heart sounds.  Pulmonary:     Effort: Pulmonary effort is normal.     Breath sounds: Normal breath sounds.  Neurological:     Mental Status: She is alert.     BP 137/81   Pulse 82   Ht 5\' 2"  (1.575 m)   Wt 171 lb (77.6 kg)   SpO2 95%   BMI 31.28 kg/m  Wt Readings from Last 3 Encounters:  09/23/23 171 lb (77.6 kg)  05/21/23 175 lb 0.6 oz (79.4 kg)  05/08/23 179 lb (81.2 kg)    Lab Results  Component Value Date   TSH 1.940 06/20/2023   Lab Results  Component Value Date   WBC 5.9 06/20/2023   HGB 13.9 06/20/2023   HCT 41.3 06/20/2023   MCV 93 06/20/2023   PLT 237 06/20/2023   Lab Results  Component Value Date   NA 142 06/20/2023   K 4.4 06/20/2023   CO2 23 06/20/2023   GLUCOSE 108 (H) 06/20/2023   BUN 20 06/20/2023   CREATININE 0.71 06/20/2023   BILITOT 0.6 06/20/2023   ALKPHOS 110 06/20/2023   AST 21 06/20/2023   ALT 18 06/20/2023   PROT 6.7 06/20/2023   ALBUMIN 4.4 06/20/2023   CALCIUM 9.3 06/20/2023   EGFR 94 06/20/2023   Lab Results  Component Value Date   CHOL 190 06/20/2023   Lab Results  Component Value Date   HDL 56 06/20/2023   Lab Results  Component Value Date   LDLCALC 112 (H) 06/20/2023   Lab Results  Component Value Date   TRIG 127 06/20/2023   Lab Results   Component Value Date   CHOLHDL 3.4 06/20/2023   Lab Results  Component Value Date   HGBA1C 6.1 (H) 06/20/2023      Assessment & Plan:  Hypertension, essential Assessment & Plan: Blood pressure  is controlled. The patient takes enalapril  10 mg daily and is encouraged to continue her current treatment regimen. She denies headaches, dizziness, blurred vision, chest pain, or palpitations. A low-sodium diet and increased physical activity were encouraged. BP Readings from Last 3 Encounters:  09/23/23 137/81  05/21/23 138/78  12/31/22 138/88       Prediabetes Assessment & Plan: Lifestyle modifications for prediabetes were discussed, including adopting a heart-healthy diet and increasing physical activity. The patient was encouraged to decrease her intake of high-sugar foods and beverages. She verbalized understanding and is aware of the plan of care.  Lab Results  Component Value Date   HGBA1C 6.1 (H) 06/20/2023         Other migraine without status migrainosus, not intractable Assessment & Plan: She takes eletriptan  20 mg as needed for abortive treatment and venlafaxine  37.5 mg daily for preventative treatment No complaints or concerns voiced Encouraged to continue treatment regimen  Orders: -     Eletriptan  Hydrobromide; Take 1 tablet (20 mg total) by mouth as needed for migraine or headache. May repeat in 2 hours if headache persists or recurs.  Dispense: 15 tablet; Refill: 0  Vitamin D  deficiency -     Vitamin D  (Ergocalciferol ); Take 1 capsule (50,000 Units total) by mouth every 7 (seven) days.  Dispense: 20 capsule; Refill: 1 -     VITAMIN D  25 Hydroxy (Vit-D Deficiency, Fractures)  IFG (impaired fasting glucose) -     Hemoglobin A1c  Other hyperlipidemia -     Lipid panel -     CMP14+EGFR -     CBC with Differential/Platelet  TSH (thyroid-stimulating hormone deficiency) -     TSH + free T4  Note: This chart has been completed using Restaurant manager, fast food software, and while attempts have been made to ensure accuracy, certain words and phrases may not be transcribed as intended.    Follow-up: Return in about 4 months (around 01/23/2024).   Hazel Leveille, FNP

## 2023-09-27 NOTE — Assessment & Plan Note (Signed)
She takes eletriptan 20 mg as needed for abortive treatment and venlafaxine 37.5 mg daily for preventative treatment No complaints or concerns voiced Encouraged to continue treatment regimen

## 2023-09-27 NOTE — Assessment & Plan Note (Signed)
 Lifestyle modifications for prediabetes were discussed, including adopting a heart-healthy diet and increasing physical activity. The patient was encouraged to decrease her intake of high-sugar foods and beverages. She verbalized understanding and is aware of the plan of care.  Lab Results  Component Value Date   HGBA1C 6.1 (H) 06/20/2023

## 2023-09-27 NOTE — Assessment & Plan Note (Addendum)
 Blood pressure is controlled. The patient takes enalapril  10 mg daily and is encouraged to continue her current treatment regimen. She denies headaches, dizziness, blurred vision, chest pain, or palpitations. A low-sodium diet and increased physical activity were encouraged. BP Readings from Last 3 Encounters:  09/23/23 137/81  05/21/23 138/78  12/31/22 138/88

## 2023-10-01 ENCOUNTER — Other Ambulatory Visit: Payer: Self-pay | Admitting: Family Medicine

## 2023-10-01 DIAGNOSIS — I10 Essential (primary) hypertension: Secondary | ICD-10-CM

## 2023-11-08 ENCOUNTER — Other Ambulatory Visit: Payer: Self-pay | Admitting: Family Medicine

## 2023-11-08 DIAGNOSIS — E782 Mixed hyperlipidemia: Secondary | ICD-10-CM

## 2023-12-11 LAB — LIPID PANEL
Chol/HDL Ratio: 3.6 ratio (ref 0.0–4.4)
Cholesterol, Total: 210 mg/dL — ABNORMAL HIGH (ref 100–199)
HDL: 58 mg/dL (ref >39)
LDL Chol Calc (NIH): 129 mg/dL — ABNORMAL HIGH (ref 0–99)
Triglycerides: 132 mg/dL (ref 0–149)
VLDL Cholesterol Cal: 23 mg/dL (ref 5–40)

## 2023-12-11 LAB — CMP14+EGFR
ALT: 19 IU/L (ref 0–32)
AST: 16 IU/L (ref 0–40)
Albumin: 4.3 g/dL (ref 3.9–4.9)
Alkaline Phosphatase: 107 IU/L (ref 44–121)
BUN/Creatinine Ratio: 27 (ref 12–28)
BUN: 21 mg/dL (ref 8–27)
Bilirubin Total: 0.6 mg/dL (ref 0.0–1.2)
CO2: 21 mmol/L (ref 20–29)
Calcium: 9.5 mg/dL (ref 8.7–10.3)
Chloride: 102 mmol/L (ref 96–106)
Creatinine, Ser: 0.78 mg/dL (ref 0.57–1.00)
Globulin, Total: 2 g/dL (ref 1.5–4.5)
Glucose: 112 mg/dL — ABNORMAL HIGH (ref 70–99)
Potassium: 5 mmol/L (ref 3.5–5.2)
Sodium: 141 mmol/L (ref 134–144)
Total Protein: 6.3 g/dL (ref 6.0–8.5)
eGFR: 83 mL/min/1.73 (ref >59)

## 2023-12-11 LAB — CBC WITH DIFFERENTIAL/PLATELET
Basophils Absolute: 0.1 x10E3/uL (ref 0.0–0.2)
Basos: 1 %
EOS (ABSOLUTE): 0.3 x10E3/uL (ref 0.0–0.4)
Eos: 4 %
Hematocrit: 43 % (ref 34.0–46.6)
Hemoglobin: 14 g/dL (ref 11.1–15.9)
Immature Grans (Abs): 0 x10E3/uL (ref 0.0–0.1)
Immature Granulocytes: 0 %
Lymphocytes Absolute: 2.4 x10E3/uL (ref 0.7–3.1)
Lymphs: 36 %
MCH: 30.8 pg (ref 26.6–33.0)
MCHC: 32.6 g/dL (ref 31.5–35.7)
MCV: 95 fL (ref 79–97)
Monocytes Absolute: 0.4 x10E3/uL (ref 0.1–0.9)
Monocytes: 6 %
Neutrophils Absolute: 3.6 x10E3/uL (ref 1.4–7.0)
Neutrophils: 53 %
Platelets: 225 x10E3/uL (ref 150–450)
RBC: 4.55 x10E6/uL (ref 3.77–5.28)
RDW: 12.7 % (ref 11.7–15.4)
WBC: 6.8 x10E3/uL (ref 3.4–10.8)

## 2023-12-11 LAB — TSH+FREE T4
Free T4: 1.13 ng/dL (ref 0.82–1.77)
TSH: 2.48 u[IU]/mL (ref 0.450–4.500)

## 2023-12-11 LAB — HEMOGLOBIN A1C
Est. average glucose Bld gHb Est-mCnc: 123 mg/dL
Hgb A1c MFr Bld: 5.9 % — ABNORMAL HIGH (ref 4.8–5.6)

## 2023-12-11 LAB — VITAMIN D 25 HYDROXY (VIT D DEFICIENCY, FRACTURES): Vit D, 25-Hydroxy: 59.1 ng/mL (ref 30.0–100.0)

## 2023-12-13 ENCOUNTER — Ambulatory Visit: Payer: Self-pay | Admitting: Family Medicine

## 2024-01-04 ENCOUNTER — Other Ambulatory Visit: Payer: Self-pay | Admitting: Family Medicine

## 2024-01-04 DIAGNOSIS — I10 Essential (primary) hypertension: Secondary | ICD-10-CM

## 2024-01-23 ENCOUNTER — Ambulatory Visit (INDEPENDENT_AMBULATORY_CARE_PROVIDER_SITE_OTHER): Admitting: Family Medicine

## 2024-01-23 ENCOUNTER — Encounter: Payer: Self-pay | Admitting: Family Medicine

## 2024-01-23 VITALS — BP 138/72 | HR 78 | Ht 62.0 in | Wt 172.0 lb

## 2024-01-23 DIAGNOSIS — I10 Essential (primary) hypertension: Secondary | ICD-10-CM

## 2024-01-23 DIAGNOSIS — G43809 Other migraine, not intractable, without status migrainosus: Secondary | ICD-10-CM

## 2024-01-23 MED ORDER — ELETRIPTAN HYDROBROMIDE 20 MG PO TABS
20.0000 mg | ORAL_TABLET | ORAL | 0 refills | Status: DC | PRN
Start: 2024-01-23 — End: 2024-04-24

## 2024-01-23 NOTE — Assessment & Plan Note (Signed)
She takes eletriptan 20 mg as needed for abortive treatment and venlafaxine 37.5 mg daily for preventative treatment No complaints or concerns voiced Encouraged to continue treatment regimen

## 2024-01-23 NOTE — Patient Instructions (Addendum)
 I appreciate the opportunity to provide care to you today!    Follow up:  5 months  Labs: next  visit  For a Healthier YOU, I Recommend: Reducing your intake of sugar, sodium, carbohydrates, and saturated fats. Increasing your fiber intake by incorporating more whole grains, fruits, and vegetables into your meals. Setting healthy goals with a focus on lowering your consumption of carbs, sugar, and unhealthy fats. Adding variety to your diet by including a wide range of fruits and vegetables. Cutting back on soda and limiting processed foods as much as possible. Staying active: In addition to taking your weight loss medication, aim for at least 150 minutes of moderate-intensity physical activity each week for optimal results.    Please follow up if your symptoms worsen or fail to improve.    Please continue to a heart-healthy diet and increase your physical activities. Try to exercise for at least five days a week.    It was a pleasure to see you and I look forward to continuing to work together on your health and well-being. Please do not hesitate to call the office if you need care or have questions about your care.  In case of emergency, please visit the Emergency Department for urgent care, or contact our clinic at [phone] to schedule an appointment. We're here to help you!   Have a wonderful day and week. With Gratitude, Lakeita Panther MSN, FNP-BC

## 2024-01-23 NOTE — Assessment & Plan Note (Signed)
 Blood pressure is controlled. The patient takes enalapril  10 mg daily and is encouraged to continue her current treatment regimen. She denies headaches, dizziness, blurred vision, chest pain, or palpitations. A low-sodium diet and increased physical activity were encouraged. BP Readings from Last 3 Encounters:  01/23/24 138/72  09/23/23 137/81  05/21/23 138/78

## 2024-01-23 NOTE — Progress Notes (Signed)
 Established Patient Office Visit  Subjective:  Patient ID: Erin Griffin, female    DOB: [birth year]  Age: 68 y.o. MRN: [id number]  CC:  Chief Complaint  Patient presents with   Hypertension    HPI Erin Griffin is a 68 y.o. female  with a past medical history of hypertension and migraines, presents for follow-up of chronic medical conditions. She reports experiencing approximately three migraine episodes per month, which are typically relieved with her current treatment regimen of Relpax  20 mg. She denies any red flag symptoms such as vision loss, neurological deficits, sudden severe headache, or new onset headache pattern.    Past Medical History:  Diagnosis Date   Heart murmur    [facility]ia 06/19/2020   Hyperlipidemia    [facility] 06/19/2020   Hypertension    [facility]ia 06/19/2020   Migraine    no aura    Past Surgical History:  Procedure Laterality Date   APPENDECTOMY N/A    [facility]ia 06/19/2020   OVARIAN CYST REMOVAL Right     Family History  Problem Relation Age of Onset   Breast cancer Mother    Breast cancer Sister     Social History   Socioeconomic History   Marital status: Married    Spouse name: Not on file   Number of children: Not on file   Years of education: Not on file   Highest education level: Some college, no degree  Occupational History   Occupation: Retired  Tobacco Use   Smoking status: Never   Smokeless tobacco: Never  Vaping Use   Vaping status: Never Used  Substance and Sexual Activity   Alcohol use: Yes    Alcohol/week: 2.0 standard drinks of alcohol    Types: 2 Glasses of wine per week    Comment: socially   Drug use: Never   Sexual activity: Yes  Other Topics Concern   Not on file  Social History Narrative   Not on file   Social Drivers of Health   Financial Resource Strain: Low Risk  (01/19/2024)   Overall Financial Resource Strain (CARDIA)    Difficulty of Paying Living Expenses: Not hard at all  Food Insecurity: No Food  Insecurity (01/19/2024)   Hunger Vital Sign    Worried About Running Out of Food in the Last Year: Never true    Ran Out of Food in the Last Year: Never true  Transportation Needs: No Transportation Needs (01/19/2024)   PRAPARE - Administrator, Civil Service (Medical): No    Lack of Transportation (Non-Medical): No  Physical Activity: Sufficiently Active (01/19/2024)   Exercise Vital Sign    Days of Exercise per Week: 5 days    Minutes of Exercise per Session: 60 min  Stress: No Stress Concern Present (01/19/2024)   [employer] of Occupational Health - Occupational Stress Questionnaire    Feeling of Stress: Not at all  Social Connections: Socially Integrated (01/19/2024)   Social Connection and Isolation Panel    Frequency of Communication with Friends and Family: More than three times a week    Frequency of Social Gatherings with Friends and Family: Once a week    Attends Religious Services: More than 4 times per year    Active Member of [employer] or Organizations: Yes    Attends Banker Meetings: More than 4 times per year    Marital Status: Married  Catering manager Violence: Not At Risk (05/08/2023)   Humiliation, Afraid, Rape, and Kick questionnaire  Fear of Current or Ex-Partner: No    Emotionally Abused: No    Physically Abused: No    Sexually Abused: No    Outpatient Medications Prior to Visit  Medication Sig Dispense Refill   enalapril  (VASOTEC ) 10 MG tablet TAKE 1 TABLET(10 MG) BY MOUTH DAILY 90 tablet 0   simvastatin  (ZOCOR ) 40 MG tablet TAKE 1 TABLET(40 MG) BY MOUTH AT BEDTIME 90 tablet 3   Vitamin D , Ergocalciferol , (DRISDOL ) 1.25 MG (50000 UNIT) CAPS capsule Take 1 capsule (50,000 Units total) by mouth every 7 (seven) days. 20 capsule 1   eletriptan  (RELPAX ) 20 MG tablet Take 1 tablet (20 mg total) by mouth as needed for migraine or headache. May repeat in 2 hours if headache persists or recurs. 15 tablet 0   No facility-administered  medications prior to visit.    Allergies  Allergen Reactions   Shellfish Allergy Hives, Itching and Swelling    ROS Review of Systems  Constitutional:  Negative for chills and fever.  Eyes:  Negative for visual disturbance.  Respiratory:  Negative for chest tightness and shortness of breath.   Neurological:  Negative for dizziness and headaches.      Objective:    Physical Exam HENT:     Head: Normocephalic.     Mouth/Throat:     Mouth: Mucous membranes are moist.  Cardiovascular:     Rate and Rhythm: Normal rate.     Heart sounds: Normal heart sounds.  Pulmonary:     Effort: Pulmonary effort is normal.     Breath sounds: Normal breath sounds.  Neurological:     Mental Status: She is alert.     BP 138/72   Pulse 78   Ht 5' 2 (1.575 m)   Wt 172 lb (78 kg)   SpO2 98%   BMI 31.46 kg/m  Wt Readings from Last 3 Encounters:  01/23/24 172 lb (78 kg)  09/23/23 171 lb (77.6 kg)  05/21/23 175 lb 0.6 oz (79.4 kg)    Lab Results  Component Value Date   TSH 2.480 12/10/2023   Lab Results  Component Value Date   WBC 6.8 12/10/2023   HGB 14.0 12/10/2023   HCT 43.0 12/10/2023   MCV 95 12/10/2023   PLT 225 12/10/2023   Lab Results  Component Value Date   NA 141 12/10/2023   K 5.0 12/10/2023   CO2 21 12/10/2023   GLUCOSE 112 (H) 12/10/2023   BUN 21 12/10/2023   CREATININE 0.78 12/10/2023   BILITOT 0.6 12/10/2023   ALKPHOS 107 12/10/2023   AST 16 12/10/2023   ALT 19 12/10/2023   PROT 6.3 12/10/2023   ALBUMIN 4.3 12/10/2023   CALCIUM 9.5 12/10/2023   EGFR 83 12/10/2023   Lab Results  Component Value Date   CHOL 210 (H) 12/10/2023   Lab Results  Component Value Date   HDL 58 12/10/2023   Lab Results  Component Value Date   LDLCALC 129 (H) 12/10/2023   Lab Results  Component Value Date   TRIG 132 12/10/2023   Lab Results  Component Value Date   CHOLHDL 3.6 12/10/2023   Lab Results  Component Value Date   HGBA1C 5.9 (H) 12/10/2023       Assessment & Plan:  Hypertension, essential Assessment & Plan: Blood pressure is controlled. The patient takes enalapril  10 mg daily and is encouraged to continue her current treatment regimen. She denies headaches, dizziness, blurred vision, chest pain, or palpitations. A low-sodium diet and increased physical  activity were encouraged. BP Readings from Last 3 Encounters:  01/23/24 138/72  09/23/23 137/81  05/21/23 138/78       Other migraine without status migrainosus, not intractable Assessment & Plan: She takes eletriptan  20 mg as needed for abortive treatment and venlafaxine  37.5 mg daily for preventative treatment No complaints or concerns voiced Encouraged to continue treatment regimen  Orders: -     Eletriptan  Hydrobromide; Take 1 tablet (20 mg total) by mouth as needed for migraine or headache. May repeat in 2 hours if headache persists or recurs.  Dispense: 15 tablet; Refill: 0  Note: This chart has been completed using Engineer, civil (consulting) software, and while attempts have been made to ensure accuracy, certain words and phrases may not be transcribed as intended.    Follow-up: Return in about 5 months (around 06/24/2024).   Drayk Humbarger, FNP

## 2024-02-09 ENCOUNTER — Other Ambulatory Visit: Payer: Self-pay | Admitting: Family Medicine

## 2024-02-09 DIAGNOSIS — I10 Essential (primary) hypertension: Secondary | ICD-10-CM

## 2024-02-25 ENCOUNTER — Other Ambulatory Visit: Payer: Self-pay | Admitting: Family Medicine

## 2024-02-25 DIAGNOSIS — E559 Vitamin D deficiency, unspecified: Secondary | ICD-10-CM

## 2024-04-24 ENCOUNTER — Other Ambulatory Visit: Payer: Self-pay | Admitting: Family Medicine

## 2024-04-24 DIAGNOSIS — G43809 Other migraine, not intractable, without status migrainosus: Secondary | ICD-10-CM

## 2024-05-06 ENCOUNTER — Other Ambulatory Visit: Payer: Self-pay | Admitting: Family Medicine

## 2024-05-06 DIAGNOSIS — I10 Essential (primary) hypertension: Secondary | ICD-10-CM

## 2024-05-11 ENCOUNTER — Ambulatory Visit: Payer: BC Managed Care – PPO

## 2024-05-11 VITALS — Ht 62.0 in | Wt 173.0 lb

## 2024-05-11 DIAGNOSIS — Z0000 Encounter for general adult medical examination without abnormal findings: Secondary | ICD-10-CM

## 2024-05-11 DIAGNOSIS — Z780 Asymptomatic menopausal state: Secondary | ICD-10-CM

## 2024-05-11 NOTE — Progress Notes (Signed)
 Chief Complaint  Patient presents with   Medicare Wellness     Subjective:   Erin Griffin is a 68 y.o. female who presents for a Medicare Annual Wellness Visit.  Visit info / Clinical Intake: Medicare Wellness Visit Type:: Subsequent Annual Wellness Visit Persons participating in visit and providing information:: patient Medicare Wellness Visit Mode:: Video Since this visit was completed virtually, some vitals may be partially provided or unavailable. Missing vitals are due to the limitations of the virtual format.: Documented vitals are patient reported If Telephone or Video please confirm:: I connected with patient using audio/video enable telemedicine. I verified patient identity with two identifiers, discussed telehealth limitations, and patient agreed to proceed. Patient Location:: home Provider Location:: office Interpreter Needed?: No Pre-visit prep was completed: yes AWV questionnaire completed by patient prior to visit?: yes Date:: 05/07/24 Living arrangements:: (Patient-Rptd) lives with spouse/significant other Patient's Overall Health Status Rating: (Patient-Rptd) good Typical amount of pain: (Patient-Rptd) none Does pain affect daily life?: (Patient-Rptd) no Are you currently prescribed opioids?: no  Dietary Habits and Nutritional Risks How many meals a day?: (Patient-Rptd) 3 Eats fruit and vegetables daily?: (Patient-Rptd) yes Most meals are obtained by: (Patient-Rptd) preparing own meals In the last 2 weeks, have you had any of the following?: none Diabetic:: no  Functional Status Activities of Daily Living (to include ambulation/medication): (Patient-Rptd) Independent Ambulation: (Patient-Rptd) Independent Medication Administration: (Patient-Rptd) Independent Home Management (perform basic housework or laundry): (Patient-Rptd) Independent Manage your own finances?: (Patient-Rptd) yes Primary transportation is: (Patient-Rptd) driving Concerns about vision?:  no *vision screening is required for WTM* Concerns about hearing?: no  Fall Screening Falls in the past year?: (Patient-Rptd) 0 Number of falls in past year: 0 Was there an injury with Fall?: 0 Fall Risk Category Calculator: 0 Patient Fall Risk Level: Low Fall Risk  Fall Risk Patient at Risk for Falls Due to: No Fall Risks Fall risk Follow up: Falls evaluation completed; Education provided; Falls prevention discussed  Home and Transportation Safety: All rugs have non-skid backing?: (Patient-Rptd) yes All stairs or steps have railings?: (Patient-Rptd) yes Grab bars in the bathtub or shower?: (Patient-Rptd) yes Have non-skid surface in bathtub or shower?: (Patient-Rptd) yes Good home lighting?: (Patient-Rptd) yes Regular seat belt use?: (Patient-Rptd) yes Hospital stays in the last year:: (Patient-Rptd) no  Cognitive Assessment Difficulty concentrating, remembering, or making decisions? : (Patient-Rptd) no Will 6CIT or Mini Cog be Completed: no 6CIT or Mini Cog Declined: patient alert, oriented, able to answer questions appropriately and recall recent events  Advance Directives (For Healthcare) Does Patient Have a Medical Advance Directive?: No Would patient like information on creating a medical advance directive?: Yes (MAU/Ambulatory/Procedural Areas - Information given)  Reviewed/Updated  Reviewed/Updated: Reviewed All (Medical, Surgical, Family, Medications, Allergies, Care Teams, Patient Goals)    Allergies (verified) Shellfish allergy   Current Medications (verified) Outpatient Encounter Medications as of 05/11/2024  Medication Sig   eletriptan  (RELPAX ) 20 MG tablet TAKE 1 TABLET BY MOUTH AS NEEDED FOR MIGRAINE OR HEADACHE. MAY REPEAT IN 2 HOURS IF HEADACHE PERSISTS OR RECURS   enalapril  (VASOTEC ) 10 MG tablet TAKE 1 TABLET(10 MG) BY MOUTH DAILY   simvastatin  (ZOCOR ) 40 MG tablet TAKE 1 TABLET(40 MG) BY MOUTH AT BEDTIME   Vitamin D , Ergocalciferol , (DRISDOL ) 1.25 MG  (50000 UNIT) CAPS capsule TAKE 1 CAPSULE BY MOUTH EVERY 7 DAYS.   No facility-administered encounter medications on file as of 05/11/2024.    History: Past Medical History:  Diagnosis Date   Allergy  Heart murmur    [employer] 06/19/2020   Hyperlipidemia    [employer] 06/19/2020   Hypertension    [employer] 06/19/2020   Migraine    no aura   Past Surgical History:  Procedure Laterality Date   APPENDECTOMY N/A    [employer] 06/19/2020   OVARIAN CYST REMOVAL Right    Family History  Problem Relation Age of Onset   Breast cancer Mother    Cancer Mother    Breast cancer Sister    Cancer Sister    COPD Father    Diabetes Father    Hypertension Father    Social History   Occupational History   Occupation: Retired  Tobacco Use   Smoking status: Never   Smokeless tobacco: Never  Vaping Use   Vaping status: Never Used  Substance and Sexual Activity   Alcohol use: Yes    Alcohol/week: 1.0 standard drink of alcohol    Types: 1 Glasses of wine per week    Comment: socially   Drug use: Never   Sexual activity: Yes    Birth control/protection: None   Tobacco Counseling Counseling given: Yes  SDOH Screenings   Food Insecurity: No Food Insecurity (05/07/2024)  Housing: Low Risk  (05/07/2024)  Transportation Needs: No Transportation Needs (05/07/2024)  Utilities: Not At Risk (05/11/2024)  Alcohol Screen: Low Risk  (01/19/2024)  Depression (PHQ2-9): Low Risk  (05/11/2024)  Financial Resource Strain: Low Risk  (05/07/2024)  Physical Activity: Sufficiently Active (05/07/2024)  Social Connections: Socially Integrated (05/07/2024)  Stress: No Stress Concern Present (05/07/2024)  Tobacco Use: Low Risk  (05/11/2024)  Health Literacy: Adequate Health Literacy (05/11/2024)   See flowsheets for full screening details  Depression Screen PHQ 2 & 9 Depression Scale- Over the past 2 weeks, how often have you been bothered by any of the following problems? Little interest or pleasure in doing  things: 0 Feeling down, depressed, or hopeless (PHQ Adolescent also includes...irritable): 0 PHQ-2 Total Score: 0 Trouble falling or staying asleep, or sleeping too much: 0 Feeling tired or having little energy: 0 Poor appetite or overeating (PHQ Adolescent also includes...weight loss): 0 Feeling bad about yourself - or that you are a failure or have let yourself or your family down: 0 Trouble concentrating on things, such as reading the newspaper or watching television (PHQ Adolescent also includes...like school work): 0 Moving or speaking so slowly that other people could have noticed. Or the opposite - being so fidgety or restless that you have been moving around a lot more than usual: 0 Thoughts that you would be better off dead, or of hurting yourself in some way: 0 PHQ-9 Total Score: 0 If you checked off any problems, how difficult have these problems made it for you to do your work, take care of things at home, or get along with other people?: Not difficult at all  Depression Treatment Depression Interventions/Treatment : [id number] Score <4 Follow-up Not Indicated     Goals Addressed               This Visit's Progress     Remain active and healthy (pt-stated)               Objective:    Today's Vitals   05/11/24 0757  Weight: 173 lb (78.5 kg)  Height: 5' 2 (1.575 m)   Body mass index is 31.64 kg/m.  Hearing/Vision screen Hearing Screening - Comments:: Patient denies any hearing difficulties.   Vision Screening - Comments:: Wears rx glasses - up  to date with routine eye exams with  [employer] and Health Maintenance Health Maintenance  Topic Date Due   Zoster Vaccines- Shingrix (1 of 2) Never done   Bone Density Scan  03/02/2024   Medicare Annual Wellness (AWV)  05/07/2024   Pneumococcal Vaccine: 50+ Years (1 of 1 - PCV) 05/20/2024 (Originally 09/07/2006)   Influenza Vaccine  09/01/2024 (Originally 01/03/2024)   Mammogram  09/18/2024    Colonoscopy  09/08/2025   DTaP/Tdap/Td (2 - Td or Tdap) 12/10/2027   Hepatitis C Screening  Completed   Meningococcal B Vaccine  Aged Out   COVID-19 Vaccine  Discontinued        Assessment/Plan:  This is a routine wellness examination for Erin Griffin.  Patient Care Team: Bacchus, Meade PEDLAR, FNP as PCP - General (Family Medicine) [facility])  I have personally reviewed and noted the following in the patient's chart:   Medical and social history Use of alcohol, tobacco or illicit drugs  Current medications and supplements including opioid prescriptions. Functional ability and status Nutritional status Physical activity Advanced directives List of other physicians Hospitalizations, surgeries, and ER visits in previous 12 months Vitals Screenings to include cognitive, depression, and falls Referrals and appointments  Orders Placed This Encounter  Procedures   DG Bone Density    Standing Status:   Future    Expiration Date:   05/11/2025    Reason for Exam (SYMPTOM  OR DIAGNOSIS REQUIRED):   post menopausal estrogen deficient    Preferred imaging location?:   [facility]   In addition, I have reviewed and discussed with patient certain preventive protocols, quality metrics, and best practice recommendations. A written personalized care plan for preventive services as well as general preventive health recommendations were provided to patient.   Sarita Hakanson, CMA   05/11/2024   No follow-ups on file.  After Visit Summary: (MyChart) Due to this being a telephonic visit, the after visit summary with patients personalized plan was offered to patient via MyChart   Nurse Notes: dexa scan ordered

## 2024-05-11 NOTE — Patient Instructions (Signed)
 Erin Griffin,  Thank you for taking the time for your Medicare Wellness Visit. I appreciate your continued commitment to your health goals. Please review the care plan we discussed, and feel free to reach out if I can assist you further.  Please note that Annual Wellness Visits do not include a physical exam. Some assessments may be limited, especially if the visit was conducted virtually. If needed, we may recommend an in-person follow-up with your provider.  Ongoing Care Seeing your primary care provider every 3 to 6 months helps us  monitor your health and provide consistent, personalized care.   1 yr follow up with Medicare Wellness Nurse:  May 12, 2025 at 8:00 am in office  Referrals If a referral was made during today's visit and you haven't received any updates within two weeks, please contact the referred provider directly to check on the status.  Osteoporosis Screening An order was placed for you to have your Osteoporosis Screening. Call the number below to schedule that [facility]  [phone]   Recommended Screenings:  Health Maintenance  Topic Date Due   Zoster (Shingles) Vaccine (1 of 2) Never done   Medicare Annual Wellness Visit  05/07/2024   Pneumococcal Vaccine for age over 52 (1 of 1 - PCV) 05/20/2024*   Flu Shot  09/01/2024*   Breast Cancer Screening  09/18/2024   Colon Cancer Screening  09/08/2025   Osteoporosis screening with Bone Density Scan  03/03/2027   DTaP/Tdap/Td vaccine (2 - Td or Tdap) 12/10/2027   Hepatitis C Screening  Completed   Meningitis B Vaccine  Aged Out   COVID-19 Vaccine  Discontinued  *Topic was postponed. The date shown is not the original due date.       05/07/2024   12:26 PM  Advanced Directives  Does Patient Have a Medical Advance Directive? No  Would patient like information on creating a medical advance directive? Yes (MAU/Ambulatory/Procedural Areas - Information given)    Vision: Annual vision screenings are recommended  for early detection of glaucoma, cataracts, and diabetic retinopathy. These exams can also reveal signs of chronic conditions such as diabetes and high blood pressure.  Dental: Annual dental screenings help detect early signs of oral cancer, gum disease, and other conditions linked to overall health, including heart disease and diabetes.  Please see the attached documents for additional preventive care recommendations.

## 2024-05-18 ENCOUNTER — Inpatient Hospital Stay (HOSPITAL_COMMUNITY): Admission: RE | Admit: 2024-05-18 | Discharge: 2024-05-18 | Attending: Family Medicine

## 2024-05-18 DIAGNOSIS — Z780 Asymptomatic menopausal state: Secondary | ICD-10-CM

## 2024-05-30 ENCOUNTER — Ambulatory Visit: Payer: Self-pay | Admitting: Family Medicine

## 2024-06-24 ENCOUNTER — Other Ambulatory Visit: Payer: Self-pay | Admitting: Family Medicine

## 2024-06-24 DIAGNOSIS — G43809 Other migraine, not intractable, without status migrainosus: Secondary | ICD-10-CM

## 2024-06-25 ENCOUNTER — Encounter: Payer: Self-pay | Admitting: Family Medicine

## 2024-06-25 ENCOUNTER — Telehealth: Admitting: Family Medicine

## 2024-06-25 DIAGNOSIS — G43809 Other migraine, not intractable, without status migrainosus: Secondary | ICD-10-CM | POA: Diagnosis not present

## 2024-06-25 MED ORDER — ELETRIPTAN HYDROBROMIDE 20 MG PO TABS: 20.0000 mg | ORAL_TABLET | ORAL | 1 refills | Status: AC | PRN

## 2024-06-25 NOTE — Assessment & Plan Note (Signed)
 Refills sent to the pharmacy Encouraged the patient to continue her treatment regimen as prescribed.

## 2024-06-25 NOTE — Progress Notes (Signed)
" ° °  Virtual Visit via Video Note  I connected with Erin Griffin on 06/25/24 at 11:00 AM EST by a video enabled telemedicine application and verified that I am speaking with the correct person using two identifiers.  Patient Location: Home Provider Location: Home Office  I discussed the limitations, risks, security, and privacy concerns of performing an evaluation and management service by video and the availability of in person appointments. I also discussed with the patient that there may be a patient responsible charge related to this service. The patient expressed understanding and agreed to proceed.  Subjective: PCP: Edman Meade PEDLAR, FNP  Chief Complaint  Patient presents with   Migraine    Would like to discuss med change with quantity due to pharmacy issues.   Follow-up    Follow up   HPI The patient reports that she would like a change in the quantity of eletriptan  to 12 tablets per month, which is the amount covered by her insurance. She reports that the medication has been effective in managing her migraines. She denies any other complaints or concerns today.   ROS: Per HPI Current Medications[1]  Observations/Objective: There were no vitals filed for this visit. Physical Exam Patient is well-developed, well-nourished in no acute distress.  Resting comfortably at home.  Head is normocephalic, atraumatic.  No labored breathing.  Speech is clear and coherent with logical content.  Patient is alert and oriented at baseline.   Assessment and Plan: Other migraine without status migrainosus, not intractable Assessment & Plan: Refills sent to the pharmacy Encouraged the patient to continue her treatment regimen as prescribed.    Orders: -     Eletriptan  Hydrobromide; Take 1 tablet (20 mg total) by mouth as needed for migraine or headache. May repeat in 2 hours if headache persists or recurs.  Dispense: 12 tablet; Refill: 1  Note: This chart has been completed using  [employer] (consulting) software, and while attempts have been made to ensure accuracy, certain words and phrases may not be transcribed as intended.    Follow Up Instructions: No follow-ups on file.   I discussed the assessment and treatment plan with the patient. The patient was provided an opportunity to ask questions, and all were answered. The patient agreed with the plan and demonstrated an understanding of the instructions.   The patient was advised to call back or seek an in-person evaluation if the symptoms worsen or if the condition fails to improve as anticipated.  The above assessment and management plan was discussed with the patient. The patient verbalized understanding of and has agreed to the management plan.   Meade PEDLAR Edman, FNP     [1]  Current Outpatient Medications:    enalapril  (VASOTEC ) 10 MG tablet, TAKE 1 TABLET(10 MG) BY MOUTH DAILY, Disp: 90 tablet, Rfl: 0   simvastatin  (ZOCOR ) 40 MG tablet, TAKE 1 TABLET(40 MG) BY MOUTH AT BEDTIME, Disp: 90 tablet, Rfl: 3   Vitamin D , Ergocalciferol , (DRISDOL ) 1.25 MG (50000 UNIT) CAPS capsule, TAKE 1 CAPSULE BY MOUTH EVERY 7 DAYS., Disp: 20 capsule, Rfl: 1   eletriptan  (RELPAX ) 20 MG tablet, Take 1 tablet (20 mg total) by mouth as needed for migraine or headache. May repeat in 2 hours if headache persists or recurs., Disp: 12 tablet, Rfl: 1  "

## 2024-11-11 ENCOUNTER — Ambulatory Visit: Payer: Self-pay | Admitting: Nurse Practitioner

## 2025-05-12 ENCOUNTER — Ambulatory Visit
